# Patient Record
Sex: Female | Born: 1985 | Race: White | Marital: Married | Smoking: Never smoker
Health system: Southern US, Community
[De-identification: ages and names within clinical notes are randomized; demographics above are authoritative.]

## PROBLEM LIST (undated history)

## (undated) DIAGNOSIS — R51 Headache: Secondary | ICD-10-CM

## (undated) DIAGNOSIS — J45909 Unspecified asthma, uncomplicated: Secondary | ICD-10-CM

## (undated) DIAGNOSIS — F329 Major depressive disorder, single episode, unspecified: Secondary | ICD-10-CM

## (undated) DIAGNOSIS — O479 False labor, unspecified: Secondary | ICD-10-CM

## (undated) DIAGNOSIS — O4700 False labor before 37 completed weeks of gestation, unspecified trimester: Secondary | ICD-10-CM

## (undated) DIAGNOSIS — R519 Headache, unspecified: Secondary | ICD-10-CM

## (undated) DIAGNOSIS — F32A Depression, unspecified: Secondary | ICD-10-CM

## (undated) HISTORY — DX: Depression, unspecified: F32.A

## (undated) HISTORY — PX: HERNIA REPAIR: SHX51

## (undated) HISTORY — PX: WISDOM TOOTH EXTRACTION: SHX21

## (undated) HISTORY — DX: Major depressive disorder, single episode, unspecified: F32.9

## (undated) HISTORY — PX: FINGER SURGERY: SHX640

## (undated) HISTORY — PX: DILATION AND CURETTAGE OF UTERUS: SHX78

## (undated) HISTORY — PX: TONSILLECTOMY: SUR1361

## (undated) HISTORY — PX: TENDON REPAIR: SHX5111

## (undated) HISTORY — DX: Headache, unspecified: R51.9

## (undated) HISTORY — DX: Headache: R51

---

## 2013-05-05 HISTORY — PX: SHOULDER SURGERY: SHX246

## 2013-12-27 ENCOUNTER — Ambulatory Visit (INDEPENDENT_AMBULATORY_CARE_PROVIDER_SITE_OTHER): Payer: 59 | Admitting: Psychology

## 2013-12-27 DIAGNOSIS — F4323 Adjustment disorder with mixed anxiety and depressed mood: Secondary | ICD-10-CM

## 2014-01-12 ENCOUNTER — Ambulatory Visit (INDEPENDENT_AMBULATORY_CARE_PROVIDER_SITE_OTHER): Payer: 59 | Admitting: Psychology

## 2014-01-12 DIAGNOSIS — F4323 Adjustment disorder with mixed anxiety and depressed mood: Secondary | ICD-10-CM

## 2014-01-26 ENCOUNTER — Ambulatory Visit (INDEPENDENT_AMBULATORY_CARE_PROVIDER_SITE_OTHER): Payer: 59 | Admitting: Psychology

## 2014-01-26 DIAGNOSIS — F4323 Adjustment disorder with mixed anxiety and depressed mood: Secondary | ICD-10-CM

## 2014-02-05 ENCOUNTER — Encounter (HOSPITAL_COMMUNITY): Payer: Self-pay | Admitting: Emergency Medicine

## 2014-02-05 ENCOUNTER — Emergency Department (HOSPITAL_COMMUNITY): Payer: 59

## 2014-02-05 ENCOUNTER — Emergency Department (HOSPITAL_COMMUNITY)
Admission: EM | Admit: 2014-02-05 | Discharge: 2014-02-06 | Disposition: A | Discharge status: Home / Self Care | Payer: 59 | Attending: Emergency Medicine | Admitting: Emergency Medicine

## 2014-02-05 ENCOUNTER — Emergency Department (HOSPITAL_COMMUNITY)
Admission: EM | Admit: 2014-02-05 | Discharge: 2014-02-05 | Disposition: A | Discharge status: Nursing Home (Includes ICF) | Payer: 59 | Source: Home / Self Care | Attending: Family Medicine | Admitting: Family Medicine

## 2014-02-05 DIAGNOSIS — Z792 Long term (current) use of antibiotics: Secondary | ICD-10-CM | POA: Insufficient documentation

## 2014-02-05 DIAGNOSIS — Z3202 Encounter for pregnancy test, result negative: Secondary | ICD-10-CM | POA: Insufficient documentation

## 2014-02-05 DIAGNOSIS — J45909 Unspecified asthma, uncomplicated: Secondary | ICD-10-CM | POA: Diagnosis not present

## 2014-02-05 DIAGNOSIS — N8320 Unspecified ovarian cysts: Secondary | ICD-10-CM | POA: Insufficient documentation

## 2014-02-05 DIAGNOSIS — Z79899 Other long term (current) drug therapy: Secondary | ICD-10-CM | POA: Insufficient documentation

## 2014-02-05 DIAGNOSIS — N83202 Unspecified ovarian cyst, left side: Secondary | ICD-10-CM

## 2014-02-05 DIAGNOSIS — R1032 Left lower quadrant pain: Secondary | ICD-10-CM | POA: Diagnosis present

## 2014-02-05 DIAGNOSIS — Z9889 Other specified postprocedural states: Secondary | ICD-10-CM | POA: Diagnosis not present

## 2014-02-05 DIAGNOSIS — R197 Diarrhea, unspecified: Secondary | ICD-10-CM | POA: Diagnosis not present

## 2014-02-05 DIAGNOSIS — R140 Abdominal distension (gaseous): Secondary | ICD-10-CM

## 2014-02-05 DIAGNOSIS — R109 Unspecified abdominal pain: Secondary | ICD-10-CM

## 2014-02-05 HISTORY — DX: Unspecified asthma, uncomplicated: J45.909

## 2014-02-05 LAB — URINALYSIS, ROUTINE W REFLEX MICROSCOPIC
Bilirubin Urine: NEGATIVE
Glucose, UA: NEGATIVE mg/dL
Ketones, ur: NEGATIVE mg/dL
LEUKOCYTES UA: NEGATIVE
NITRITE: NEGATIVE
PH: 5 (ref 5.0–8.0)
Protein, ur: NEGATIVE mg/dL
SPECIFIC GRAVITY, URINE: 1.007 (ref 1.005–1.030)
Urobilinogen, UA: 0.2 mg/dL (ref 0.0–1.0)

## 2014-02-05 LAB — POCT URINALYSIS DIP (DEVICE)
Bilirubin Urine: NEGATIVE
GLUCOSE, UA: NEGATIVE mg/dL
Ketones, ur: NEGATIVE mg/dL
LEUKOCYTES UA: NEGATIVE
NITRITE: NEGATIVE
PH: 6 (ref 5.0–8.0)
Protein, ur: NEGATIVE mg/dL
Specific Gravity, Urine: 1.01 (ref 1.005–1.030)
Urobilinogen, UA: 0.2 mg/dL (ref 0.0–1.0)

## 2014-02-05 LAB — CBC WITH DIFFERENTIAL/PLATELET
BASOS ABS: 0 10*3/uL (ref 0.0–0.1)
BASOS PCT: 0 % (ref 0–1)
EOS ABS: 0.1 10*3/uL (ref 0.0–0.7)
EOS PCT: 1 % (ref 0–5)
HCT: 43.7 % (ref 36.0–46.0)
Hemoglobin: 15 g/dL (ref 12.0–15.0)
Lymphocytes Relative: 23 % (ref 12–46)
Lymphs Abs: 1.8 10*3/uL (ref 0.7–4.0)
MCH: 29.9 pg (ref 26.0–34.0)
MCHC: 34.3 g/dL (ref 30.0–36.0)
MCV: 87.1 fL (ref 78.0–100.0)
Monocytes Absolute: 0.6 10*3/uL (ref 0.1–1.0)
Monocytes Relative: 8 % (ref 3–12)
Neutro Abs: 5.4 10*3/uL (ref 1.7–7.7)
Neutrophils Relative %: 68 % (ref 43–77)
PLATELETS: 259 10*3/uL (ref 150–400)
RBC: 5.02 MIL/uL (ref 3.87–5.11)
RDW: 13.3 % (ref 11.5–15.5)
WBC: 7.9 10*3/uL (ref 4.0–10.5)

## 2014-02-05 LAB — COMPREHENSIVE METABOLIC PANEL
ALBUMIN: 3.9 g/dL (ref 3.5–5.2)
ALT: 11 U/L (ref 0–35)
AST: 29 U/L (ref 0–37)
Alkaline Phosphatase: 60 U/L (ref 39–117)
Anion gap: 13 (ref 5–15)
BUN: 7 mg/dL (ref 6–23)
CALCIUM: 9.7 mg/dL (ref 8.4–10.5)
CO2: 23 mEq/L (ref 19–32)
Chloride: 101 mEq/L (ref 96–112)
Creatinine, Ser: 0.6 mg/dL (ref 0.50–1.10)
GFR calc non Af Amer: >90 mL/min (ref >90)
Glucose, Bld: 94 mg/dL (ref 70–99)
Potassium: 4.7 mEq/L (ref 3.7–5.3)
Sodium: 137 mEq/L (ref 137–147)
TOTAL PROTEIN: 8.3 g/dL (ref 6.0–8.3)
Total Bilirubin: 0.3 mg/dL (ref 0.3–1.2)

## 2014-02-05 LAB — PREGNANCY, URINE: PREG TEST UR: NEGATIVE

## 2014-02-05 LAB — LIPASE, BLOOD: Lipase: 36 U/L (ref 11–59)

## 2014-02-05 LAB — URINE MICROSCOPIC-ADD ON

## 2014-02-05 LAB — POCT PREGNANCY, URINE: Preg Test, Ur: NEGATIVE

## 2014-02-05 MED ORDER — IOHEXOL 300 MG/ML  SOLN
25.0000 mL | Freq: Once | INTRAMUSCULAR | Status: AC | PRN
  Administered 2014-02-05: 25 mL via ORAL

## 2014-02-05 MED ORDER — ONDANSETRON HCL 4 MG/2ML IJ SOLN
4.0000 mg | Freq: Once | INTRAMUSCULAR | Status: AC
  Administered 2014-02-05: 4 mg via INTRAVENOUS
  Filled 2014-02-05: qty 2

## 2014-02-05 MED ORDER — IOHEXOL 300 MG/ML  SOLN
100.0000 mL | Freq: Once | INTRAMUSCULAR | Status: AC | PRN
  Administered 2014-02-05: 100 mL via INTRAVENOUS

## 2014-02-05 MED ORDER — MORPHINE SULFATE 4 MG/ML IJ SOLN
4.0000 mg | Freq: Once | INTRAMUSCULAR | Status: AC
  Administered 2014-02-05: 4 mg via INTRAVENOUS
  Filled 2014-02-05: qty 1

## 2014-02-05 NOTE — ED Provider Notes (Signed)
CSN: [id number]     Arrival date & time 02/05/14  1807 History   First MD Initiated Contact with Patient 02/05/14 1820     Chief Complaint  Patient presents with  . Bloated   (Consider location/radiation/quality/duration/timing/severity/associated sxs/prior Treatment) HPI Comments: Has had several abdominal surgeries in the past. Reports previous bilateral inguinal hernia repair with associated wound infection requiring multiple surgical debridements and wound vac placements. Also has had two Caesarian sections. Reports symptoms are made worse if she tries to eat or drink. States she feels as if she is "5 months pregnant."      Patient is a 28 y.o. female presenting with abdominal pain. The history is provided by the patient.  Abdominal Pain Pain location:  Periumbilical and LLQ Pain quality: cramping and sharp   Pain radiates to:  Does not radiate Pain severity:  Moderate Onset quality:  Gradual Duration:  36 hours Progression:  Waxing and waning Chronicity:  New Ineffective treatments:  Bowel activity (Has tried [place] stool softener and laxative with no relief) Associated symptoms: anorexia, nausea and vomiting   Associated symptoms: no belching, no chest pain, no chills, no constipation, no cough, no diarrhea, no dysuria, no fatigue, no fever, no flatus, no hematemesis, no hematochezia, no hematuria, no melena, no shortness of breath, no sore throat, no vaginal bleeding and no vaginal discharge   Risk factors: multiple surgeries     Past Medical History  Diagnosis Date  . Asthma    Past Surgical History  Procedure Laterality Date  . Hernia repair    . Cesarean section    . Tendon repair     No family history on file. History  Substance Use Topics  . Smoking status: Never Smoker   . Smokeless tobacco: Not on file  . Alcohol Use: Yes   OB History   Grav Para Term Preterm Abortions TAB SAB Ect Mult Living                 Review of Systems  Constitutional:  Positive for appetite change. Negative for fever, chills and fatigue.  HENT: Negative for sore throat.   Respiratory: Negative for cough, chest tightness and shortness of breath.   Cardiovascular: Negative for chest pain.  Gastrointestinal: Positive for nausea, vomiting, abdominal pain, abdominal distention and anorexia. Negative for diarrhea, constipation, blood in stool, melena, hematochezia, rectal pain, flatus and hematemesis.  Endocrine: Negative for polydipsia, polyphagia and polyuria.  Genitourinary: Negative for dysuria, frequency, hematuria, flank pain, vaginal bleeding, vaginal discharge, vaginal pain and pelvic pain.  Musculoskeletal: Negative for back pain.  Skin: Negative.   Neurological: Negative for dizziness, syncope, weakness and light-headedness.    Allergies  Iodine and Shellfish allergy  Home Medications   Prior to Admission medications   Medication Sig Start Date End Date Taking? Authorizing Provider  ALBUTEROL IN Inhale into the lungs.   Yes Historical Provider, MD  norethindrone-ethinyl estradiol (JUNEL FE,GILDESS FE,LOESTRIN FE) 1-20 MG-MCG tablet Take 1 tablet by mouth daily.   Yes Historical Provider, MD   BP 120/83  Pulse 90  Temp(Src) 98.6 F (37 C) (Oral)  Resp 16  SpO2 95%  LMP 01/08/2014 Physical Exam  Nursing note and vitals reviewed. Constitutional: She is oriented to person, place, and time. She appears well-developed and well-nourished. No distress.  HENT:  Head: Normocephalic and atraumatic.  Eyes: Conjunctivae are normal. No scleral icterus.  Cardiovascular: Normal rate, regular rhythm and normal heart sounds.   Pulmonary/Chest: Effort normal and breath sounds normal. No  respiratory distress. She has no wheezes.  Abdominal: Soft. She exhibits distension. She exhibits no fluid wave and no mass. Bowel sounds are increased. There is no hepatosplenomegaly. There is tenderness in the epigastric area, periumbilical area and left lower quadrant.  There is no rigidity, no rebound, no guarding, no CVA tenderness, no tenderness at McBurney's point and negative Murphy's sign. No hernia.  Musculoskeletal: Normal range of motion.  Neurological: She is alert and oriented to person, place, and time.  Skin: Skin is warm and dry.  Psychiatric: She has a normal mood and affect. Her behavior is normal.    ED Course  Procedures (including critical care time) Labs Review Labs Reviewed  POCT URINALYSIS DIP (DEVICE) - Abnormal; Notable for the following:    Hgb urine dipstick TRACE (*)    All other components within normal limits  POCT PREGNANCY, URINE    Imaging Review No results found.   MDM   1. Abdominal pain, unspecified abdominal location   2. Abdominal bloating    Given patient's history of multiple abdominal surgeries and current presenting set of symptoms, I am concerned about the possibility of early or partial bowel obstruction.  UA grossly normal and UPT negative.  [facility] for continued evaluation.     Ria ClockJennifer Lee H Hamdi Vari, PA 02/05/14 1856  As [facility] staff was moving patient to shuttle to transport to ER, husband decided that he would prefer to transport her in their private vehicle. Therefore, patient's disposition was changed from transfer to discharge as they declined original plan for transfer.   Ria ClockJennifer Lee H Akeem Heppler, [place] 02/05/14 1910

## 2014-02-05 NOTE — ED Notes (Signed)
On discharge, husband aggravated about wanting to take wife himself.  Attempted to explain the benefit of shuttle taking patient to ed while he made arrangements for 28 year old and 28 year old.  Patient left with husband/2 small children going to the ed.

## 2014-02-05 NOTE — ED Notes (Signed)
Abdominal bloating and cramping that started Saturday 10/3.  Patient has taken stool softeners yesterday and today.  Loose stool this afternoon 3:00 pm.  Denies burning with urination, denies vaginal discharge

## 2014-02-05 NOTE — ED Notes (Addendum)
[facility] here for abd pain/bloating, last BM 1500 scant & loose after laxatives and stool softeners, last normal BM was Friday. Also reports nausea, dry heaving. last ate 1400. No meds PTA. Concern for SBO. [facility]. pinpints pain to LLQ. (denies:urinary or vaginal sx, bleeding, fever or back pain).

## 2014-02-05 NOTE — ED Provider Notes (Signed)
28 year old female, history of prior abdominal surgery who presents from the urgent care to rule out small bowel obstruction or other pathologic abdominal process after developing abdominal pain in the left lower quadrant, she feels distended, has not had a bowel movement in several days, took stool softeners and had a subsequent small liquid stool prior to arrival. She is nauseated but not throwing up and on exam has mild distention, no definite exams to percussion and focal tenderness in the lower abdomen and the epigastrium. Labs, CT, symptomatic control, disposition pending workup and patient improvement.  CT shows no SBO but shows focal 3.5 cm cyst - no rupture, no torsion, stable for d/c.  I saw and evaluated the patient, reviewed the resident's note and I agree with the findings and plan.   Final diagnoses:  Left ovarian cyst      Vida RollerBrian D Kyree Adriano, MD 02/06/14 [phone]

## 2014-02-05 NOTE — ED Provider Notes (Signed)
CSN: [id number]     Arrival date & time 02/05/14  1913 History   First MD Initiated Contact with Patient 02/05/14 1939     Chief Complaint  Patient presents with  . Abdominal Pain     (Consider location/radiation/quality/duration/timing/severity/associated sxs/prior Treatment) Patient is a 28 y.o. female presenting with abdominal pain.  Abdominal Pain Pain location:  LLQ Pain quality: bloating and cramping   Pain radiates to:  Periumbilical region Pain severity:  Severe Onset quality:  Gradual Duration:  2 days Timing:  Constant Progression:  Worsening Chronicity:  New Context: previous surgery   Context: not eating and not recent sexual activity   Relieved by:  None tried Worsened by:  Palpation Ineffective treatments:  None tried Associated symptoms: diarrhea and nausea   Associated symptoms: no chest pain, no chills, no constipation, no cough, no dysuria, no fever, no shortness of breath, no sore throat and no vomiting   Risk factors: multiple surgeries     Past Medical History  Diagnosis Date  . Asthma    Past Surgical History  Procedure Laterality Date  . Hernia repair    . Cesarean section    . Tendon repair     No family history on file. History  Substance Use Topics  . Smoking status: Never Smoker   . Smokeless tobacco: Not on file  . Alcohol Use: Yes   OB History   Grav Para Term Preterm Abortions TAB SAB Ect Mult Living                 Review of Systems  Constitutional: Negative for fever and chills.  HENT: Negative for congestion and sore throat.   Eyes: Negative for visual disturbance.  Respiratory: Negative for cough, shortness of breath and wheezing.   Cardiovascular: Negative for chest pain.  Gastrointestinal: Positive for nausea, abdominal pain and diarrhea. Negative for vomiting and constipation.  Genitourinary: Negative for dysuria, difficulty urinating and vaginal pain.  Musculoskeletal: Negative for arthralgias and myalgias.  Skin:  Negative for rash.  Neurological: Negative for syncope and headaches.  Psychiatric/Behavioral: Negative for behavioral problems.  All other systems reviewed and are negative.     Allergies  Shellfish allergy and Iodine  Home Medications   Prior to Admission medications   Medication Sig Start Date End Date Taking? Authorizing Provider  albuterol (PROVENTIL HFA;VENTOLIN HFA) 108 (90 BASE) MCG/ACT inhaler Inhale 2 puffs into the lungs every 6 (six) hours as needed for wheezing or shortness of breath.   Yes Historical Provider, MD  amoxicillin (AMOXIL) 500 MG capsule Take 500 mg by mouth 3 (three) times daily.    Historical Provider, MD  norethindrone-ethinyl estradiol (JUNEL FE,GILDESS FE,LOESTRIN FE) 1-20 MG-MCG tablet Take 1 tablet by mouth daily.    Historical Provider, MD   BP 127/67  Pulse 82  Temp(Src) 98 F (36.7 C) (Oral)  Resp 18  SpO2 100%  LMP 01/08/2014 Physical Exam  Vitals reviewed. Constitutional: She is oriented to person, place, and time. She appears well-developed and well-nourished. No distress.  HENT:  Head: Normocephalic and atraumatic.  Eyes: EOM are normal.  Neck: Normal range of motion.  Cardiovascular: Normal rate, regular rhythm and normal heart sounds.   No murmur heard. Pulmonary/Chest: Effort normal and breath sounds normal. No respiratory distress. She has no wheezes.  Abdominal: Soft. There is tenderness in the left lower quadrant. There is no rigidity, no rebound, no guarding and no CVA tenderness.  Genitourinary: There is no lesion on the right labia. There  is no lesion on the left labia. Cervix exhibits no motion tenderness, no discharge and no friability. Right adnexum displays no tenderness. Left adnexum displays no tenderness. No erythema around the vagina. No vaginal discharge found.  Musculoskeletal: She exhibits no edema.  Neurological: She is alert and oriented to person, place, and time.  Skin: She is not diaphoretic.  Psychiatric: She  has a normal mood and affect. Her behavior is normal.    ED Course  Procedures (including critical care time) Labs Review Labs Reviewed  URINALYSIS, ROUTINE W REFLEX MICROSCOPIC - Abnormal; Notable for the following:    Hgb urine dipstick SMALL (*)    All other components within normal limits  CBC WITH DIFFERENTIAL  COMPREHENSIVE METABOLIC PANEL  LIPASE, BLOOD  PREGNANCY, URINE  URINE MICROSCOPIC-ADD ON    Imaging Review [place] Transvaginal Non-ob  02/06/2014   CLINICAL DATA:  Acute onset of left-sided pelvic pain. Bloating, nausea and diarrhea. Symptoms for 2 days. Initial encounter.  EXAM: TRANSABDOMINAL AND TRANSVAGINAL ULTRASOUND OF PELVIS  DOPPLER ULTRASOUND OF OVARIES  TECHNIQUE: Both transabdominal and transvaginal ultrasound examinations of the pelvis were performed. Transabdominal technique was performed for global imaging of the pelvis including uterus, ovaries, adnexal regions, and pelvic cul-de-sac.  It was necessary to proceed with endovaginal exam following the transabdominal exam to visualize the uterus and ovaries in greater detail. Color and duplex Doppler ultrasound was utilized to evaluate blood flow to the ovaries.  COMPARISON:  CT of the abdomen and pelvis performed earlier today at 9:41 p.m.  FINDINGS: Uterus  Measurements: 8.4 x 4.6 x 5.0 cm. No fibroids or other mass visualized.  Endometrium  Thickness: 0.4 cm.  No focal abnormality visualized.  Right ovary  Not visualized on this study.  Left ovary  Measurements: 4.0 x 3.7 x 3.7 cm. A simple cyst is noted at the left ovary, measuring 3.5 x 2.7 x 2.9 cm.  Pulsed Doppler evaluation of both ovaries demonstrates normal low-resistance arterial and venous waveforms.  Other findings  Trace free fluid is noted within the pelvic cul-de-sac.  IMPRESSION: 1. 3.5 cm simple cyst noted at the left ovary; this is likely physiologic in nature, and does not require follow-up. 2. Unremarkable pelvic ultrasound.   Electronically Signed   By:  Roanna RaiderJeffery  Chang M.D.   On: 02/06/2014 00:10   [place] Pelvis Complete  02/06/2014   CLINICAL DATA:  Acute onset of left-sided pelvic pain. Bloating, nausea and diarrhea. Symptoms for 2 days. Initial encounter.  EXAM: TRANSABDOMINAL AND TRANSVAGINAL ULTRASOUND OF PELVIS  DOPPLER ULTRASOUND OF OVARIES  TECHNIQUE: Both transabdominal and transvaginal ultrasound examinations of the pelvis were performed. Transabdominal technique was performed for global imaging of the pelvis including uterus, ovaries, adnexal regions, and pelvic cul-de-sac.  It was necessary to proceed with endovaginal exam following the transabdominal exam to visualize the uterus and ovaries in greater detail. Color and duplex Doppler ultrasound was utilized to evaluate blood flow to the ovaries.  COMPARISON:  CT of the abdomen and pelvis performed earlier today at 9:41 p.m.  FINDINGS: Uterus  Measurements: 8.4 x 4.6 x 5.0 cm. No fibroids or other mass visualized.  Endometrium  Thickness: 0.4 cm.  No focal abnormality visualized.  Right ovary  Not visualized on this study.  Left ovary  Measurements: 4.0 x 3.7 x 3.7 cm. A simple cyst is noted at the left ovary, measuring 3.5 x 2.7 x 2.9 cm.  Pulsed Doppler evaluation of both ovaries demonstrates normal low-resistance arterial and venous waveforms.  Other  findings  Trace free fluid is noted within the pelvic cul-de-sac.  IMPRESSION: 1. 3.5 cm simple cyst noted at the left ovary; this is likely physiologic in nature, and does not require follow-up. 2. Unremarkable pelvic ultrasound.   Electronically Signed   By: Roanna Raider M.D.   On: 02/06/2014 00:10   Ct Abdomen Pelvis W Contrast  02/05/2014   CLINICAL DATA:  Left upper quadrant and left lower quadrant abdominal pain. Bloating. Nausea. Diarrhea. Symptoms for 2 days. Personal history of C-section and hernia repairs.  EXAM: CT ABDOMEN AND PELVIS WITH CONTRAST  TECHNIQUE: Multidetector CT imaging of the abdomen and pelvis was performed using the  standard protocol following bolus administration of intravenous contrast.  CONTRAST:  OMNIPAQUE IOHEXOL 300 MG/ML  SOLN  COMPARISON:  None.  FINDINGS: The lung bases are clear without focal nodule, mass, or airspace disease. The heart size is normal. No significant pleural or pericardial effusion is present.  The liver and spleen are within normal limits. The stomach, duodenum, and pancreas are unremarkable. Common bile duct and gallbladder are normal. The adrenal glands are normal bilaterally. Kidneys and ureters are within normal limits. Urinary bladder is within normal limits.  The rectosigmoid colon is within normal limits. Of the remainder the colon is unremarkable. The appendix is visualized and normal. The small bowel is unremarkable.  The uterus is within normal limits. A cystic lesion within the left adnexa measures 3.9 x 2.9 x 4.8 cm. A small amount of free fluid within the anatomic pelvis is likely physiologic. The right adnexa is within normal limits. There is no significant adenopathy.  Bone windows are unremarkable.  IMPRESSION: 1. Cystic lesion in the left adnexa measures 3.9 x 2.9 x 4.8 cm. This appears to be a benign cyst. Follow-up ultrasound at 6 or 10 weeks could be used for further evaluation. 2. Otherwise unremarkable CT of the abdomen and pelvis.   Electronically Signed   By: Gennette Pac M.D.   On: 02/05/2014 22:04   [place] Art/ven Flow Abd Pelv Doppler  02/06/2014   CLINICAL DATA:  Acute onset of left-sided pelvic pain. Bloating, nausea and diarrhea. Symptoms for 2 days. Initial encounter.  EXAM: TRANSABDOMINAL AND TRANSVAGINAL ULTRASOUND OF PELVIS  DOPPLER ULTRASOUND OF OVARIES  TECHNIQUE: Both transabdominal and transvaginal ultrasound examinations of the pelvis were performed. Transabdominal technique was performed for global imaging of the pelvis including uterus, ovaries, adnexal regions, and pelvic cul-de-sac.  It was necessary to proceed with endovaginal exam following the  transabdominal exam to visualize the uterus and ovaries in greater detail. Color and duplex Doppler ultrasound was utilized to evaluate blood flow to the ovaries.  COMPARISON:  CT of the abdomen and pelvis performed earlier today at 9:41 p.m.  FINDINGS: Uterus  Measurements: 8.4 x 4.6 x 5.0 cm. No fibroids or other mass visualized.  Endometrium  Thickness: 0.4 cm.  No focal abnormality visualized.  Right ovary  Not visualized on this study.  Left ovary  Measurements: 4.0 x 3.7 x 3.7 cm. A simple cyst is noted at the left ovary, measuring 3.5 x 2.7 x 2.9 cm.  Pulsed Doppler evaluation of both ovaries demonstrates normal low-resistance arterial and venous waveforms.  Other findings  Trace free fluid is noted within the pelvic cul-de-sac.  IMPRESSION: 1. 3.5 cm simple cyst noted at the left ovary; this is likely physiologic in nature, and does not require follow-up. 2. Unremarkable pelvic ultrasound.   Electronically Signed   By: Beryle Beams.D.  On: 02/06/2014 00:10     EKG Interpretation None      MDM   Final diagnoses:  Left ovarian cyst    Patient is a 28 year old female with a history of multiple normal surgeries secondary to pregnancy that presents with 2 days of abdominal bloating and left lower quadrant pain. Patient was seen in urgent care and there was concern for a small bowel traction and she was sent to the ED for further evaluation. Patient is afebrile stable vital signs. On exam the patient appears comfortable however does have left lower quadrant tenderness to palpation but no signs of peritonitis. On pelvic exam patient has no adnexal tenderness and no cervical motion tenderness. CT is seen above significant for a large left ovarian cysts. Given concern for possible torsion ultrasound was done which showed no evidence of decreased blood flow and a simple cyst. Patient advised to use Motrin for symptom control and given information to establish with a OB/GYN for  followup.    Beverely Risen, MD 02/06/14 [phone]

## 2014-02-06 NOTE — Discharge Instructions (Signed)
Ovarian Cyst An ovarian cyst is a fluid-filled sac that forms on an ovary. The ovaries are small organs that produce eggs in women. Various types of cysts can form on the ovaries. Most are not cancerous. Many do not cause problems, and they often go away on their own. Some may cause symptoms and require treatment. Common types of ovarian cysts include:  Functional cysts--These cysts may occur every month during the menstrual cycle. This is normal. The cysts usually go away with the next menstrual cycle if the woman does not get pregnant. Usually, there are no symptoms with a functional cyst.  Endometrioma cysts--These cysts form from the tissue that lines the uterus. They are also called "chocolate cysts" because they become filled with blood that turns brown. This type of cyst can cause pain in the lower abdomen during intercourse and with your menstrual period.  Cystadenoma cysts--This type develops from the cells on the outside of the ovary. These cysts can get very big and cause lower abdomen pain and pain with intercourse. This type of cyst can twist on itself, cut off its blood supply, and cause severe pain. It can also easily rupture and cause a lot of pain.  Dermoid cysts--This type of cyst is sometimes found in both ovaries. These cysts may contain different kinds of body tissue, such as skin, teeth, hair, or cartilage. They usually do not cause symptoms unless they get very big.  Theca lutein cysts--These cysts occur when too much of a certain hormone (human chorionic gonadotropin) is produced and overstimulates the ovaries to produce an egg. This is most common after procedures used to assist with the conception of a baby (in vitro fertilization). CAUSES   Fertility drugs can cause a condition in which multiple large cysts are formed on the ovaries. This is called ovarian hyperstimulation syndrome.  A condition called polycystic ovary syndrome can cause hormonal imbalances that can lead to  nonfunctional ovarian cysts. SIGNS AND SYMPTOMS  Many ovarian cysts do not cause symptoms. If symptoms are present, they may include:  Pelvic pain or pressure.  Pain in the lower abdomen.  Pain during sexual intercourse.  Increasing girth (swelling) of the abdomen.  Abnormal menstrual periods.  Increasing pain with menstrual periods.  Stopping having menstrual periods without being pregnant. DIAGNOSIS  These cysts are commonly found during a routine or annual pelvic exam. Tests may be ordered to find out more about the cyst. These tests may include:  Ultrasound.  X-ray of the pelvis.  CT scan.  MRI.  Blood tests. TREATMENT  Many ovarian cysts go away on their own without treatment. Your health care provider may want to check your cyst regularly for 2-3 months to see if it changes. For women in menopause, it is particularly important to monitor a cyst closely because of the higher rate of ovarian cancer in menopausal women. When treatment is needed, it may include any of the following:  A procedure to drain the cyst (aspiration). This may be done using a long needle and ultrasound. It can also be done through a laparoscopic procedure. This involves using a thin, lighted tube with a tiny camera on the end (laparoscope) inserted through a small incision.  Surgery to remove the whole cyst. This may be done using laparoscopic surgery or an open surgery involving a larger incision in the lower abdomen.  Hormone treatment or birth control pills. These methods are sometimes used to help dissolve a cyst. HOME CARE INSTRUCTIONS   Only take over-the-counter   or prescription medicines as directed by your health care provider.  Follow up with your health care provider as directed.  Get regular pelvic exams and Pap tests. SEEK MEDICAL CARE IF:   Your periods are late, irregular, or painful, or they stop.  Your pelvic pain or abdominal pain does not go away.  Your abdomen becomes  larger or swollen.  You have pressure on your bladder or trouble emptying your bladder completely.  You have pain during sexual intercourse.  You have feelings of fullness, pressure, or discomfort in your stomach.  You lose weight for no apparent reason.  You feel generally ill.  You become constipated.  You lose your appetite.  You develop acne.  You have an increase in body and facial hair.  You are gaining weight, without changing your exercise and eating habits.  You think you are pregnant. SEEK IMMEDIATE MEDICAL CARE IF:   You have increasing abdominal pain.  You feel sick to your stomach (nauseous), and you throw up (vomit).  You develop a fever that comes on suddenly.  You have abdominal pain during a bowel movement.  Your menstrual periods become heavier than usual. MAKE SURE YOU:  Understand these instructions.  Will watch your condition.  Will get help right away if you are not doing well or get worse. Document Released: 04/21/2005 Document Revised: 04/26/2013 Document Reviewed: 12/27/2012 [employer] Patient Information 2015 [employer]. This information is not intended to replace advice given to you by your health care provider. Make sure you discuss any questions you have with your health care provider.  

## 2014-02-06 NOTE — ED Provider Notes (Signed)
I saw and evaluated the patient, reviewed the resident's note and I agree with the findings and plan.  Please see my separate note regarding my evaluation of the patient.     Vida RollerBrian D Raul Winterhalter, MD 02/06/14 [phone]

## 2014-02-07 NOTE — ED Provider Notes (Signed)
Medical screening examination/treatment/procedure(s) were performed by resident physician or non-physician practitioner and as supervising physician I was immediately available for consultation/collaboration.   Levoy Geisen DOUGLAS MD.   Prinston Kynard D Zaccheaus Storlie, MD 02/07/14 1134 

## 2014-02-09 ENCOUNTER — Inpatient Hospital Stay (HOSPITAL_COMMUNITY)
Admission: EM | Admit: 2014-02-09 | Discharge: 2014-02-10 | Disposition: A | Discharge status: Other Acute Inpatient Hospital | Payer: 59 | Attending: Obstetrics & Gynecology | Admitting: Obstetrics & Gynecology

## 2014-02-09 ENCOUNTER — Encounter (HOSPITAL_COMMUNITY): Payer: Self-pay | Admitting: Emergency Medicine

## 2014-02-09 DIAGNOSIS — R630 Anorexia: Secondary | ICD-10-CM | POA: Diagnosis not present

## 2014-02-09 DIAGNOSIS — Z3202 Encounter for pregnancy test, result negative: Secondary | ICD-10-CM | POA: Diagnosis not present

## 2014-02-09 DIAGNOSIS — R1032 Left lower quadrant pain: Secondary | ICD-10-CM | POA: Diagnosis present

## 2014-02-09 DIAGNOSIS — J45909 Unspecified asthma, uncomplicated: Secondary | ICD-10-CM | POA: Insufficient documentation

## 2014-02-09 DIAGNOSIS — Z9889 Other specified postprocedural states: Secondary | ICD-10-CM | POA: Diagnosis not present

## 2014-02-09 DIAGNOSIS — Z79899 Other long term (current) drug therapy: Secondary | ICD-10-CM | POA: Diagnosis not present

## 2014-02-09 DIAGNOSIS — N946 Dysmenorrhea, unspecified: Secondary | ICD-10-CM | POA: Diagnosis not present

## 2014-02-09 DIAGNOSIS — N8320 Unspecified ovarian cysts: Secondary | ICD-10-CM | POA: Insufficient documentation

## 2014-02-09 DIAGNOSIS — N83202 Unspecified ovarian cyst, left side: Secondary | ICD-10-CM

## 2014-02-09 LAB — URINE MICROSCOPIC-ADD ON

## 2014-02-09 LAB — I-STAT CHEM 8, ED
BUN: 9 mg/dL (ref 6–23)
Calcium, Ion: 1.08 mmol/L — ABNORMAL LOW (ref 1.12–1.23)
Chloride: 109 mEq/L (ref 96–112)
Creatinine, Ser: 0.6 mg/dL (ref 0.50–1.10)
GLUCOSE: 96 mg/dL (ref 70–99)
HCT: 40 % (ref 36.0–46.0)
Hemoglobin: 13.6 g/dL (ref 12.0–15.0)
Potassium: 3.7 mEq/L (ref 3.7–5.3)
Sodium: 137 mEq/L (ref 137–147)
TCO2: 22 mmol/L (ref 0–100)

## 2014-02-09 LAB — CBC WITH DIFFERENTIAL/PLATELET
BASOS ABS: 0 10*3/uL (ref 0.0–0.1)
BASOS PCT: 0 % (ref 0–1)
Eosinophils Absolute: 0.1 10*3/uL (ref 0.0–0.7)
Eosinophils Relative: 1 % (ref 0–5)
HCT: 37.4 % (ref 36.0–46.0)
Hemoglobin: 12.9 g/dL (ref 12.0–15.0)
Lymphocytes Relative: 31 % (ref 12–46)
Lymphs Abs: 1.8 10*3/uL (ref 0.7–4.0)
MCH: 30 pg (ref 26.0–34.0)
MCHC: 34.5 g/dL (ref 30.0–36.0)
MCV: 87 fL (ref 78.0–100.0)
Monocytes Absolute: 0.5 10*3/uL (ref 0.1–1.0)
Monocytes Relative: 9 % (ref 3–12)
NEUTROS PCT: 59 % (ref 43–77)
Neutro Abs: 3.4 10*3/uL (ref 1.7–7.7)
PLATELETS: 235 10*3/uL (ref 150–400)
RBC: 4.3 MIL/uL (ref 3.87–5.11)
RDW: 13.3 % (ref 11.5–15.5)
WBC: 5.7 10*3/uL (ref 4.0–10.5)

## 2014-02-09 LAB — URINALYSIS, ROUTINE W REFLEX MICROSCOPIC
Bilirubin Urine: NEGATIVE
Glucose, UA: NEGATIVE mg/dL
KETONES UR: NEGATIVE mg/dL
Leukocytes, UA: NEGATIVE
NITRITE: NEGATIVE
PH: 6 (ref 5.0–8.0)
Protein, ur: NEGATIVE mg/dL
Specific Gravity, Urine: 1.024 (ref 1.005–1.030)
Urobilinogen, UA: 0.2 mg/dL (ref 0.0–1.0)

## 2014-02-09 LAB — WET PREP, GENITAL
Trich, Wet Prep: NONE SEEN
Yeast Wet Prep HPF POC: NONE SEEN

## 2014-02-09 LAB — PREGNANCY, URINE: Preg Test, Ur: NEGATIVE

## 2014-02-09 MED ORDER — DIPHENHYDRAMINE HCL 50 MG/ML IJ SOLN
25.0000 mg | Freq: Once | INTRAMUSCULAR | Status: AC
  Administered 2014-02-09: 25 mg via INTRAVENOUS
  Filled 2014-02-09: qty 1

## 2014-02-09 MED ORDER — KETOROLAC TROMETHAMINE 30 MG/ML IJ SOLN
30.0000 mg | Freq: Once | INTRAMUSCULAR | Status: AC
  Administered 2014-02-09: 30 mg via INTRAVENOUS
  Filled 2014-02-09: qty 1

## 2014-02-09 MED ORDER — MORPHINE SULFATE 4 MG/ML IJ SOLN
4.0000 mg | Freq: Once | INTRAMUSCULAR | Status: AC
Start: 2014-02-09 — End: 2014-02-09
  Administered 2014-02-09: 4 mg via INTRAVENOUS
  Filled 2014-02-09: qty 1

## 2014-02-09 MED ORDER — OXYCODONE-ACETAMINOPHEN 5-325 MG PO TABS
1.0000 | ORAL_TABLET | Freq: Once | ORAL | Status: AC
  Administered 2014-02-09: 1 via ORAL
  Filled 2014-02-09: qty 1

## 2014-02-09 MED ORDER — MORPHINE SULFATE 4 MG/ML IJ SOLN
4.0000 mg | Freq: Once | INTRAMUSCULAR | Status: AC
  Administered 2014-02-09: 4 mg via INTRAVENOUS
  Filled 2014-02-09: qty 1

## 2014-02-09 MED ORDER — HYDROMORPHONE HCL 1 MG/ML IJ SOLN
1.0000 mg | Freq: Once | INTRAMUSCULAR | Status: AC
  Administered 2014-02-10: 1 mg via INTRAVENOUS
  Filled 2014-02-09: qty 1

## 2014-02-09 MED ORDER — ONDANSETRON 4 MG PO TBDP
8.0000 mg | ORAL_TABLET | Freq: Once | ORAL | Status: AC
  Administered 2014-02-09: 8 mg via ORAL
  Filled 2014-02-09: qty 2

## 2014-02-09 NOTE — ED Notes (Signed)
Pelvic cart ready @ bedside. 

## 2014-02-09 NOTE — ED Notes (Signed)
Pt seen Sunday and dx with ovarian cyst. Pt has follow up apt next week but pain is too severe.

## 2014-02-09 NOTE — ED Provider Notes (Signed)
CSN: [id number]     Arrival date & time 02/09/14  1837 History   First MD Initiated Contact with Patient 02/09/14 2204     Chief Complaint  Patient presents with  . Abdominal Pain    (Consider location/radiation/quality/duration/timing/severity/associated sxs/prior Treatment) HPI Comments: Patient is a 28 year old female with a history of cesarean section and hernia repair who presents to the emergency department for further evaluation of left lower quadrant abdominal pain. Patient was evaluated for same on 02/06/2014 and diagnosed with left ovarian cyst. Patient states that she has been trying to manage her symptoms at home with Tylenol and Motrin, but her pain has been constant and gradually worsening since her discharge from the ED. She states that her pain intermittently radiates down to her right thigh. Symptoms associated with nausea and anorexia. She also notes metrorrhagia; LMP 02/06/14. Patient denies associated fever, chest pain, shortness of breath, vomiting, diarrhea, melena or hematochezia, dysuria, vaginal discharge, and syncope.  Patient is a 28 y.o. female presenting with abdominal pain. The history is provided by the patient. No language interpreter was used.  Abdominal Pain Associated symptoms: nausea and vaginal bleeding     Past Medical History  Diagnosis Date  . Asthma    Past Surgical History  Procedure Laterality Date  . Hernia repair    . Cesarean section    . Tendon repair     No family history on file. History  Substance Use Topics  . Smoking status: Never Smoker   . Smokeless tobacco: Not on file  . Alcohol Use: Yes   OB History   Grav Para Term Preterm Abortions TAB SAB Ect Mult Living                  Review of Systems  Constitutional: Positive for appetite change.  Gastrointestinal: Positive for nausea and abdominal pain.  Genitourinary: Positive for vaginal bleeding.  All other systems reviewed and are negative.   Allergies  Shellfish  allergy and Iodine  Home Medications   Prior to Admission medications   Medication Sig Start Date End Date Taking? Authorizing Provider  acetaminophen (TYLENOL) 500 MG tablet Take 500 mg by mouth every 6 (six) hours as needed.   Yes Historical Provider, MD  albuterol (PROVENTIL HFA;VENTOLIN HFA) 108 (90 BASE) MCG/ACT inhaler Inhale 2 puffs into the lungs every 6 (six) hours as needed for wheezing or shortness of breath.   Yes Historical Provider, MD  amoxicillin (AMOXIL) 500 MG capsule Take 500 mg by mouth 3 (three) times daily. Completed course of medication sometime last week from today (02-09-14) per patient    Historical Provider, MD   BP 113/65  Pulse 88  Temp(Src) 97.7 F (36.5 C) (Oral)  Resp 16  Ht 5' (1.524 m)  Wt 146 lb (66.225 kg)  BMI 28.51 kg/m2  SpO2 99%  LMP 02/06/2014  Physical Exam  Nursing note and vitals reviewed. Constitutional: She is oriented to person, place, and time. She appears well-developed and well-nourished. No distress.  Nontoxic/nonseptic appearing  HENT:  Head: Normocephalic and atraumatic.  Eyes: Conjunctivae and EOM are normal. No scleral icterus.  Neck: Normal range of motion.  Cardiovascular: Normal rate, regular rhythm and normal heart sounds.   Pulmonary/Chest: Effort normal and breath sounds normal. No respiratory distress. She has no wheezes. She has no rales.  Chest expansion symmetric  Abdominal: Soft. She exhibits no distension. There is tenderness. There is no rebound and no guarding.  Soft abdomen with focal tenderness in the left  lower quadrant. No peritoneal signs or involuntary guarding. No masses.  Genitourinary: There is no rash, tenderness, lesion or injury on the right labia. There is no rash, tenderness, lesion or injury on the left labia. Uterus is not tender. Cervix exhibits no motion tenderness and no friability. Right adnexum displays no mass, no tenderness and no fullness. Left adnexum displays tenderness. Left adnexum  displays no mass and no fullness. There is bleeding (blood in vaginal vault c/w menses) around the vagina.  Musculoskeletal: Normal range of motion.  Neurological: She is alert and oriented to person, place, and time. She exhibits normal muscle tone. Coordination normal.  Skin: Skin is warm and dry. No rash noted. She is not diaphoretic. No erythema. No pallor.  Psychiatric: She has a normal mood and affect. Her behavior is normal.    ED Course  Procedures (including critical care time) Labs Review Labs Reviewed  WET PREP, GENITAL - Abnormal; Notable for the following:    Clue Cells Wet Prep HPF POC FEW (*)    WBC, Wet Prep HPF POC FEW (*)    All other components within normal limits  URINALYSIS, ROUTINE W REFLEX MICROSCOPIC - Abnormal; Notable for the following:    Hgb urine dipstick LARGE (*)    All other components within normal limits  I-STAT CHEM 8, ED - Abnormal; Notable for the following:    Calcium, Ion 1.08 (*)    All other components within normal limits  GC/CHLAMYDIA PROBE AMP  PREGNANCY, URINE  CBC WITH DIFFERENTIAL  URINE MICROSCOPIC-ADD ON    Imaging Review US Transvaginal Non-ob  02/06/2014   CLINICAL DATA:  Acute onset of left-sided pelvic pain. Bloating, nausea and diarrhea. Symptoms for 2 days. Initial encounter.  EXAM: TRANSABDOMINAL AND TRANSVAGINAL ULTRASOUND OF PELVIS  DOPPLER ULTRASOUND OF OVARIES  TECHNIQUE: Both transabdominal and transvaginal ultrasound examinations of the pelvis were performed. Transabdominal technique was performed for global imaging of the pelvis including uterus, ovaries, adnexal regions, and pelvic cul-de-sac.  It was necessary to proceed with endovaginal exam following the transabdominal exam to visualize the uterus and ovaries in greater detail. Color and duplex Doppler ultrasound was utilized to evaluate blood flow to the ovaries.  COMPARISON:  CT of the abdomen and pelvis performed earlier today at 9:41 p.m.  FINDINGS: Uterus   Measurements: 8.4 x 4.6 x 5.0 cm. No fibroids or other mass visualized.  Endometrium  Thickness: 0.4 cm.  No focal abnormality visualized.  Right ovary  Not visualized on this study.  Left ovary  Measurements: 4.0 x 3.7 x 3.7 cm. A simple cyst is noted at the left ovary, measuring 3.5 x 2.7 x 2.9 cm.  Pulsed Doppler evaluation of both ovaries demonstrates normal low-resistance arterial and venous waveforms.  Other findings  Trace free fluid is noted within the pelvic cul-de-sac.  IMPRESSION: 1. 3.5 cm simple cyst noted at the left ovary; this is likely physiologic in nature, and does not require follow-up. 2. Unremarkable pelvic ultrasound.   Electronically Signed   By: Roanna Raider M.D.   On: 02/06/2014 00:10   US Pelvis Complete  02/06/2014   CLINICAL DATA:  Acute onset of left-sided pelvic pain. Bloating, nausea and diarrhea. Symptoms for 2 days. Initial encounter.  EXAM: TRANSABDOMINAL AND TRANSVAGINAL ULTRASOUND OF PELVIS  DOPPLER ULTRASOUND OF OVARIES  TECHNIQUE: Both transabdominal and transvaginal ultrasound examinations of the pelvis were performed. Transabdominal technique was performed for global imaging of the pelvis including uterus, ovaries, adnexal regions, and pelvic cul-de-sac.  It  was necessary to proceed with endovaginal exam following the transabdominal exam to visualize the uterus and ovaries in greater detail. Color and duplex Doppler ultrasound was utilized to evaluate blood flow to the ovaries.  COMPARISON:  CT of the abdomen and pelvis performed earlier today at 9:41 p.m.  FINDINGS: Uterus  Measurements: 8.4 x 4.6 x 5.0 cm. No fibroids or other mass visualized.  Endometrium  Thickness: 0.4 cm.  No focal abnormality visualized.  Right ovary  Not visualized on this study.  Left ovary  Measurements: 4.0 x 3.7 x 3.7 cm. A simple cyst is noted at the left ovary, measuring 3.5 x 2.7 x 2.9 cm.  Pulsed Doppler evaluation of both ovaries demonstrates normal low-resistance arterial and venous  waveforms.  Other findings  Trace free fluid is noted within the pelvic cul-de-sac.  IMPRESSION: 1. 3.5 cm simple cyst noted at the left ovary; this is likely physiologic in nature, and does not require follow-up. 2. Unremarkable pelvic ultrasound.   Electronically Signed   By: Roanna Raider M.D.   On: 02/06/2014 00:10   Ct Abdomen Pelvis W Contrast  02/05/2014   CLINICAL DATA:  Left upper quadrant and left lower quadrant abdominal pain. Bloating. Nausea. Diarrhea. Symptoms for 2 days. Personal history of C-section and hernia repairs.  EXAM: CT ABDOMEN AND PELVIS WITH CONTRAST  TECHNIQUE: Multidetector CT imaging of the abdomen and pelvis was performed using the standard protocol following bolus administration of intravenous contrast.  CONTRAST:  OMNIPAQUE IOHEXOL 300 MG/ML  SOLN  COMPARISON:  None.  FINDINGS: The lung bases are clear without focal nodule, mass, or airspace disease. The heart size is normal. No significant pleural or pericardial effusion is present.  The liver and spleen are within normal limits. The stomach, duodenum, and pancreas are unremarkable. Common bile duct and gallbladder are normal. The adrenal glands are normal bilaterally. Kidneys and ureters are within normal limits. Urinary bladder is within normal limits.  The rectosigmoid colon is within normal limits. Of the remainder the colon is unremarkable. The appendix is visualized and normal. The small bowel is unremarkable.  The uterus is within normal limits. A cystic lesion within the left adnexa measures 3.9 x 2.9 x 4.8 cm. A small amount of free fluid within the anatomic pelvis is likely physiologic. The right adnexa is within normal limits. There is no significant adenopathy.  Bone windows are unremarkable.  IMPRESSION: 1. Cystic lesion in the left adnexa measures 3.9 x 2.9 x 4.8 cm. This appears to be a benign cyst. Follow-up ultrasound at 6 or 10 weeks could be used for further evaluation. 2. Otherwise unremarkable CT of  the abdomen and pelvis.   Electronically Signed   By: Gennette Pac M.D.   On: 02/05/2014 22:04   [place] Art/ven Flow Abd Pelv Doppler  02/06/2014   CLINICAL DATA:  Acute onset of left-sided pelvic pain. Bloating, nausea and diarrhea. Symptoms for 2 days. Initial encounter.  EXAM: TRANSABDOMINAL AND TRANSVAGINAL ULTRASOUND OF PELVIS  DOPPLER ULTRASOUND OF OVARIES  TECHNIQUE: Both transabdominal and transvaginal ultrasound examinations of the pelvis were performed. Transabdominal technique was performed for global imaging of the pelvis including uterus, ovaries, adnexal regions, and pelvic cul-de-sac.  It was necessary to proceed with endovaginal exam following the transabdominal exam to visualize the uterus and ovaries in greater detail. Color and duplex Doppler ultrasound was utilized to evaluate blood flow to the ovaries.  COMPARISON:  CT of the abdomen and pelvis performed earlier today at 9:41 p.m.  FINDINGS: Uterus  Measurements: 8.4 x 4.6 x 5.0 cm. No fibroids or other mass visualized.  Endometrium  Thickness: 0.4 cm.  No focal abnormality visualized.  Right ovary  Not visualized on this study.  Left ovary  Measurements: 4.0 x 3.7 x 3.7 cm. A simple cyst is noted at the left ovary, measuring 3.5 x 2.7 x 2.9 cm.  Pulsed Doppler evaluation of both ovaries demonstrates normal low-resistance arterial and venous waveforms.  Other findings  Trace free fluid is noted within the pelvic cul-de-sac.  IMPRESSION: 1. 3.5 cm simple cyst noted at the left ovary; this is likely physiologic in nature, and does not require follow-up. 2. Unremarkable pelvic ultrasound.   Electronically Signed   By: Roanna Raider M.D.   On: 02/06/2014 00:10    EKG Interpretation None      MDM   Final diagnoses:  Left lower quadrant pain  Dysmenorrhea  Left ovarian cyst    28 year old female presents to the emergency department for left lower quadrant abdominal pain. Patient was seen and evaluated for same 4 days ago. She was  diagnosed with a left ovarian cyst at this time. Cyst appeared uncomplicated on imaging. Patient states the pain has been persistent despite attempts to manage her pain with Tylenol and/or Motrin.  Patient noted to have left adnexal tenderness on exam. No cervical motion tenderness or friability. No peritoneal signs. Patient currently menstruating which is likely the cause of her hematuria today. She denies any dysuria and has no signs of urinary tract infection. Urine pregnancy negative. No leukocytosis today to suggest infection. No anemia or electrolyte imbalance. Lactate normal.  Symptoms have been gradually worsening since her visit on 02/05/2014. Given the gradual progression of symptoms, ovarian torsion thought to be less likely; however, patient has been unable to have adequate pain control after being given 30mg  Toradol, morphine 4mg  x 2, and 1 mg Dilaudid x 2. Pelvic ultrasound ordered for further evaluation at this time and obstetrics consulted for admission for pain control. Dr. Erin Fulling has accepted the patient at [facility] for further evaluation and management of abdominal pain. Patient to be transferred upon completion of her Pelvic U/S.   Filed Vitals:   02/10/14 0151 02/10/14 0450 02/10/14 0522 02/10/14 0628  BP: 113/65 119/55 122/65 111/62  Pulse: 88 79 80 75  Temp:  97.8 F (36.6 C) 98.5 F (36.9 C)   TempSrc:  Oral Oral   Resp: 16 16 18 18   Height:      Weight:      SpO2: 99% 100% 100%        Antony Madura, PA-C 02/10/14 [phone]

## 2014-02-10 ENCOUNTER — Emergency Department (HOSPITAL_COMMUNITY): Payer: 59

## 2014-02-10 ENCOUNTER — Encounter (HOSPITAL_COMMUNITY): Payer: Self-pay | Admitting: *Deleted

## 2014-02-10 DIAGNOSIS — R1032 Left lower quadrant pain: Secondary | ICD-10-CM

## 2014-02-10 DIAGNOSIS — N946 Dysmenorrhea, unspecified: Secondary | ICD-10-CM

## 2014-02-10 DIAGNOSIS — N8320 Unspecified ovarian cysts: Secondary | ICD-10-CM

## 2014-02-10 LAB — I-STAT CG4 LACTIC ACID, ED: Lactic Acid, Venous: 1.48 mmol/L (ref 0.5–2.2)

## 2014-02-10 MED ORDER — ONDANSETRON HCL 4 MG/2ML IJ SOLN
4.0000 mg | Freq: Once | INTRAMUSCULAR | Status: AC
  Administered 2014-02-10: 4 mg via INTRAVENOUS
  Filled 2014-02-10: qty 2

## 2014-02-10 MED ORDER — OXYCODONE-ACETAMINOPHEN 5-325 MG PO TABS: 1.0000 | ORAL_TABLET | ORAL | Status: DC | PRN

## 2014-02-10 MED ORDER — HYDROMORPHONE HCL 1 MG/ML IJ SOLN
1.0000 mg | Freq: Once | INTRAMUSCULAR | Status: AC
  Administered 2014-02-10: 1 mg via INTRAVENOUS
  Filled 2014-02-10: qty 1

## 2014-02-10 MED ORDER — HYDROMORPHONE HCL 1 MG/ML IJ SOLN
0.5000 mg | Freq: Once | INTRAMUSCULAR | Status: AC
  Administered 2014-02-10: 0.5 mg via INTRAVENOUS
  Filled 2014-02-10: qty 1

## 2014-02-10 MED ORDER — KETOROLAC TROMETHAMINE 30 MG/ML IJ SOLN
30.0000 mg | Freq: Once | INTRAMUSCULAR | Status: AC
  Administered 2014-02-10: 30 mg via INTRAVENOUS
  Filled 2014-02-10: qty 1

## 2014-02-10 NOTE — MAU Provider Note (Signed)
Pt was transferred as her care could not be ascertained by telephone. She was dx'd with a simple cyst and could not get pain relief at [facility].  She was eval and found to be appropriately managed by pain meds.  She was given outpt f/u and discharged. Attestation of Attending Supervision of Advanced Practitioner (CNM/NP): Evaluation and management procedures were performed by the Advanced Practitioner under my supervision and collaboration.  I have reviewed the Advanced Practitioner's note and chart, and I agree with the management and plan.  HARRAWAY-SMITH, Shareeka Yim 9:23 AM

## 2014-02-10 NOTE — ED Provider Notes (Signed)
Medical screening examination/treatment/procedure(s) were performed by non-physician practitioner and as supervising physician I was immediately available for consultation/collaboration.   EKG Interpretation None        Cindia Hustead T Holly Pring, MD 02/10/14 1644 

## 2014-02-10 NOTE — MAU Note (Signed)
Arrival via [facility]. Pt reports pain 7/10 now.

## 2014-02-10 NOTE — ED Notes (Signed)
Pt currently in US.

## 2014-02-10 NOTE — MAU Provider Note (Signed)
History     CSN: [id number]  Arrival date and time: 02/09/14 [id number]   First Provider Initiated Contact with Patient 02/10/14 0532      Chief Complaint  Patient presents with  . Abdominal Pain   HPI Ms. Margaret FlamingCrystal Lucero is a 28 y.o. [phone] with history of C/S and left ovarian simple cyst diagnosed on 02/05/14 who [facility]. The patient states that she has been taking Motrin and Tylenol at home with little relief of pain. She states that at worst tonight her pain was 8/10. Ovarian cyst was 3.5 cm on 02/05/14 and appears approximately the same size on repeat US from [facility] tonight. She states now it is 6/10. [facility] given 1 tablet Percocet, 4 mg Morphine IV x 2, 30 mg Toradol IV x 2, 1 mg Dilaudid IV x 2 and 0.5 mg Dilaudid IV x 1. Patient states LMP 02/06/14. She states that bleeding is heavier than normal. She also states that she has had inter-menstrual bleeding over the last 2 months. The patient is on LoEstrin OCPs since May 2015. She states that she was prescribed the OCPs by PCP in [place] at [facility]. She states that they also did her last pap smear which was normal in July 2015.   OB History   Grav Para Term Preterm Abortions TAB SAB Ect Mult Living                  Past Medical History  Diagnosis Date  . Asthma     Past Surgical History  Procedure Laterality Date  . Hernia repair    . Cesarean section    . Tendon repair      History reviewed. No pertinent family history.  History  Substance Use Topics  . Smoking status: Never Smoker   . Smokeless tobacco: Not on file  . Alcohol Use: Yes    Allergies:  Allergies  Allergen Reactions  . Shellfish Allergy Hives and Shortness Of Breath  . Iodine Hives    Prescriptions prior to admission  Medication Sig Dispense Refill  . acetaminophen (TYLENOL) 500 MG tablet Take 500 mg by mouth every 6 (six) hours as needed.      Marland Kitchen. albuterol (PROVENTIL HFA;VENTOLIN HFA)  108 (90 BASE) MCG/ACT inhaler Inhale 2 puffs into the lungs every 6 (six) hours as needed for wheezing or shortness of breath.      Marland Kitchen. amoxicillin (AMOXIL) 500 MG capsule Take 500 mg by mouth 3 (three) times daily. Completed course of medication sometime last week from today (02-09-14) per patient        Review of Systems  Constitutional: Negative for fever and malaise/fatigue.  Gastrointestinal: Positive for abdominal pain.  Genitourinary:       + vaginal bleeding   Physical Exam   Blood pressure 122/65, pulse 80, temperature 98.5 F (36.9 C), temperature source Oral, resp. rate 18, height 5' (1.524 m), weight 146 lb (66.225 kg), last menstrual period 02/06/2014, SpO2 100.00%.  Physical Exam  Constitutional: She is oriented to person, place, and time. She appears well-developed and well-nourished. No distress.  HENT:  Head: Normocephalic.  Cardiovascular: Normal rate.   Respiratory: Effort normal.  GI: Soft. She exhibits no distension and no mass. There is tenderness (mild tenderness to palpation of the lower abdomen, more prominent in the suprapubic region and LLQ). There is no rebound and no guarding.  Neurological: She is alert and oriented to person,  place, and time.  Skin: Skin is warm and dry. No erythema.  Psychiatric: She has a normal mood and affect.   Results for orders placed during the hospital encounter of 02/09/14 (from the past 24 hour(s))  CBC WITH DIFFERENTIAL     Status: None   Collection Time    02/09/14 10:07 PM      Result Value Ref Range   WBC 5.7  4.0 - 10.5 K/uL   RBC 4.30  3.87 - 5.11 MIL/uL   Hemoglobin 12.9  12.0 - 15.0 g/dL   HCT 57.8  46.9 - 62.9 %   MCV 87.0  78.0 - 100.0 fL   MCH 30.0  26.0 - 34.0 pg   MCHC 34.5  30.0 - 36.0 g/dL   RDW 52.8  41.3 - 24.4 %   Platelets 235  150 - 400 K/uL   Neutrophils Relative % 59  43 - 77 %   Neutro Abs 3.4  1.7 - 7.7 K/uL   Lymphocytes Relative 31  12 - 46 %   Lymphs Abs 1.8  0.7 - 4.0 K/uL   Monocytes  Relative 9  3 - 12 %   Monocytes Absolute 0.5  0.1 - 1.0 K/uL   Eosinophils Relative 1  0 - 5 %   Eosinophils Absolute 0.1  0.0 - 0.7 K/uL   Basophils Relative 0  0 - 1 %   Basophils Absolute 0.0  0.0 - 0.1 K/uL  I-STAT CHEM 8, ED     Status: Abnormal   Collection Time    02/09/14 10:32 PM      Result Value Ref Range   Sodium 137  137 - 147 mEq/L   Potassium 3.7  3.7 - 5.3 mEq/L   Chloride 109  96 - 112 mEq/L   BUN 9  6 - 23 mg/dL   Creatinine, Ser 0.10  0.50 - 1.10 mg/dL   Glucose, Bld 96  70 - 99 mg/dL   Calcium, Ion 2.72 (*) 1.12 - 1.23 mmol/L   TCO2 22  0 - 100 mmol/L   Hemoglobin 13.6  12.0 - 15.0 g/dL   HCT 53.6  64.4 - 03.4 %  URINALYSIS, ROUTINE W REFLEX MICROSCOPIC     Status: Abnormal   Collection Time    02/09/14 10:40 PM      Result Value Ref Range   Color, Urine YELLOW  YELLOW   APPearance CLEAR  CLEAR   Specific Gravity, Urine 1.024  1.005 - 1.030   pH 6.0  5.0 - 8.0   Glucose, UA NEGATIVE  NEGATIVE mg/dL   Hgb urine dipstick LARGE (*) NEGATIVE   Bilirubin Urine NEGATIVE  NEGATIVE   Ketones, ur NEGATIVE  NEGATIVE mg/dL   Protein, ur NEGATIVE  NEGATIVE mg/dL   Urobilinogen, UA 0.2  0.0 - 1.0 mg/dL   Nitrite NEGATIVE  NEGATIVE   Leukocytes, UA NEGATIVE  NEGATIVE  PREGNANCY, URINE     Status: None   Collection Time    02/09/14 10:40 PM      Result Value Ref Range   Preg Test, Ur NEGATIVE  NEGATIVE  URINE MICROSCOPIC-ADD ON     Status: None   Collection Time    02/09/14 10:40 PM      Result Value Ref Range   Squamous Epithelial / LPF RARE  RARE   WBC, UA 0-2  <3 WBC/hpf   RBC / HPF 11-20  <3 RBC/hpf   Bacteria, UA RARE  RARE   Urine-Other MUCOUS PRESENT  WET PREP, GENITAL     Status: Abnormal   Collection Time    02/09/14 11:04 PM      Result Value Ref Range   Yeast Wet Prep HPF POC NONE SEEN  NONE SEEN   Trich, Wet Prep NONE SEEN  NONE SEEN   Clue Cells Wet Prep HPF POC FEW (*) NONE SEEN   WBC, Wet Prep HPF POC FEW (*) NONE SEEN  I-STAT CG4  LACTIC ACID, ED     Status: None   Collection Time    02/10/14  2:43 AM      Result Value Ref Range   Lactic Acid, Venous 1.48  0.5 - 2.2 mmol/L   [place] Transvaginal Non-ob  02/10/2014   CLINICAL DATA:  Persistent left lower quadrant abdominal pain. Assess for ovarian torsion. Follow-up abdominal pain.  EXAM: TRANSABDOMINAL AND TRANSVAGINAL ULTRASOUND OF PELVIS  DOPPLER ULTRASOUND OF OVARIES  TECHNIQUE: Both transabdominal and transvaginal ultrasound examinations of the pelvis were performed. Transabdominal technique was performed for global imaging of the pelvis including uterus, ovaries, adnexal regions, and pelvic cul-de-sac.  It was necessary to proceed with endovaginal exam following the transabdominal exam to visualize the uterus and ovaries in greater detail. Color and duplex Doppler ultrasound was utilized to evaluate blood flow to the ovaries.  COMPARISON:  Pelvic ultrasound performed 02/05/2014  FINDINGS: Uterus  Measurements: 8.4 x 4.0 x 5.4 cm. No fibroids or other mass visualized.  Endometrium  Thickness: 0.4 cm.  No focal abnormality visualized.  Right ovary  Measurements: 3.9 x 2.4 x 1.5 cm. Normal appearance/no adnexal mass.  Left ovary  Measurements: 4.5 x 4.3 x 4.0 cm. A 4.2 x 3.4 x 3.1 cm simple cyst is noted at the left ovary.  Pulsed Doppler evaluation of both ovaries demonstrates normal low-resistance arterial and venous waveforms.  Other findings  A small amount of particulate ascites is noted within the pelvic cul-de-sac.  IMPRESSION: 1. Uterus unremarkable in appearance. No evidence of ovarian torsion. 2. 4.2 cm left ovarian cyst is likely physiologic in nature. 3. Small amount of particulate ascites within the pelvic cul-de-sac.   Electronically Signed   By: Roanna RaiderJeffery  Chang M.D.   On: 02/10/2014 05:17   [place] Pelvis Complete  02/10/2014   CLINICAL DATA:  Persistent left lower quadrant abdominal pain. Assess for ovarian torsion. Follow-up abdominal pain.  EXAM: TRANSABDOMINAL AND  TRANSVAGINAL ULTRASOUND OF PELVIS  DOPPLER ULTRASOUND OF OVARIES  TECHNIQUE: Both transabdominal and transvaginal ultrasound examinations of the pelvis were performed. Transabdominal technique was performed for global imaging of the pelvis including uterus, ovaries, adnexal regions, and pelvic cul-de-sac.  It was necessary to proceed with endovaginal exam following the transabdominal exam to visualize the uterus and ovaries in greater detail. Color and duplex Doppler ultrasound was utilized to evaluate blood flow to the ovaries.  COMPARISON:  Pelvic ultrasound performed 02/05/2014  FINDINGS: Uterus  Measurements: 8.4 x 4.0 x 5.4 cm. No fibroids or other mass visualized.  Endometrium  Thickness: 0.4 cm.  No focal abnormality visualized.  Right ovary  Measurements: 3.9 x 2.4 x 1.5 cm. Normal appearance/no adnexal mass.  Left ovary  Measurements: 4.5 x 4.3 x 4.0 cm. A 4.2 x 3.4 x 3.1 cm simple cyst is noted at the left ovary.  Pulsed Doppler evaluation of both ovaries demonstrates normal low-resistance arterial and venous waveforms.  Other findings  A small amount of particulate ascites is noted within the pelvic cul-de-sac.  IMPRESSION: 1. Uterus unremarkable in appearance. No evidence of ovarian  torsion. 2. 4.2 cm left ovarian cyst is likely physiologic in nature. 3. Small amount of particulate ascites within the pelvic cul-de-sac.   Electronically Signed   By: Roanna Raider M.D.   On: 02/10/2014 05:17   [place] Art/ven Flow Abd Pelv Doppler  02/10/2014   CLINICAL DATA:  Persistent left lower quadrant abdominal pain. Assess for ovarian torsion. Follow-up abdominal pain.  EXAM: TRANSABDOMINAL AND TRANSVAGINAL ULTRASOUND OF PELVIS  DOPPLER ULTRASOUND OF OVARIES  TECHNIQUE: Both transabdominal and transvaginal ultrasound examinations of the pelvis were performed. Transabdominal technique was performed for global imaging of the pelvis including uterus, ovaries, adnexal regions, and pelvic cul-de-sac.  It was necessary to  proceed with endovaginal exam following the transabdominal exam to visualize the uterus and ovaries in greater detail. Color and duplex Doppler ultrasound was utilized to evaluate blood flow to the ovaries.  COMPARISON:  Pelvic ultrasound performed 02/05/2014  FINDINGS: Uterus  Measurements: 8.4 x 4.0 x 5.4 cm. No fibroids or other mass visualized.  Endometrium  Thickness: 0.4 cm.  No focal abnormality visualized.  Right ovary  Measurements: 3.9 x 2.4 x 1.5 cm. Normal appearance/no adnexal mass.  Left ovary  Measurements: 4.5 x 4.3 x 4.0 cm. A 4.2 x 3.4 x 3.1 cm simple cyst is noted at the left ovary.  Pulsed Doppler evaluation of both ovaries demonstrates normal low-resistance arterial and venous waveforms.  Other findings  A small amount of particulate ascites is noted within the pelvic cul-de-sac.  IMPRESSION: 1. Uterus unremarkable in appearance. No evidence of ovarian torsion. 2. 4.2 cm left ovarian cyst is likely physiologic in nature. 3. Small amount of particulate ascites within the pelvic cul-de-sac.   Electronically Signed   By: Roanna Raider M.D.   On: 02/10/2014 05:17    MAU Course  Procedures None  MDM Labs and [place] from [facility] reviewed Simple cyst noted again today No evidence of ovarian torsion Labs are stable Discussed patient with Dr. Erin Fulling. Ok for discharge with Rx for Percocet and follow-up in [facility] in ~ 2 weeks Assessment and Plan  A: Ovarian cyst Dysmenorrhea  P: Discharge home Rx for Percocet given to patient Patient given follow-up appointment with [facility] on 02/20/14 at 1:00 pm Patient may return to MAU as needed or if her condition were to change or worsen   Marny Lowenstein, PA-C  02/10/2014, 5:32 AM

## 2014-02-10 NOTE — Discharge Instructions (Signed)
Dysmenorrhea °Menstrual cramps (dysmenorrhea) are caused by the muscles of the uterus tightening (contracting) during a menstrual period. For some women, this discomfort is merely bothersome. For others, dysmenorrhea can be severe enough to interfere with everyday activities for a few days each month. °Primary dysmenorrhea is menstrual cramps that last a couple of days when you start having menstrual periods or soon after. This often begins after a teenager starts having her period. As a woman gets older or has a baby, the cramps will usually lessen or disappear. Secondary dysmenorrhea begins later in life, lasts longer, and the pain may be stronger than primary dysmenorrhea. The pain may start before the period and last a few days after the period.  °CAUSES  °Dysmenorrhea is usually caused by an underlying problem, such as: °· The tissue lining the uterus grows outside of the uterus in other areas of the body (endometriosis). °· The endometrial tissue, which normally lines the uterus, is found in or grows into the muscular walls of the uterus (adenomyosis). °· The pelvic blood vessels are engorged with blood just before the menstrual period (pelvic congestive syndrome). °· Overgrowth of cells (polyps) in the lining of the uterus or cervix. °· Falling down of the uterus (prolapse) because of loose or stretched ligaments. °· Depression. °· Bladder problems, infection, or inflammation. °· Problems with the intestine, a tumor, or irritable bowel syndrome. °· Cancer of the female organs or bladder. °· A severely tipped uterus. °· A very tight opening or closed cervix. °· Noncancerous tumors of the uterus (fibroids). °· Pelvic inflammatory disease (PID). °· Pelvic scarring (adhesions) from a previous surgery. °· Ovarian cyst. °· An intrauterine device (IUD) used for birth control. °RISK FACTORS °You may be at greater risk of dysmenorrhea if: °· You are younger than age 30. °· You started puberty early. °· You have  irregular or heavy bleeding. °· You have never given birth. °· You have a family history of this problem. °· You are a smoker. °SIGNS AND SYMPTOMS  °· Cramping or throbbing pain in your lower abdomen. °· Headaches. °· Lower back pain. °· Nausea or vomiting. °· Diarrhea. °· Sweating or dizziness. °· Loose stools. °DIAGNOSIS  °A diagnosis is based on your history, symptoms, physical exam, diagnostic tests, or procedures. Diagnostic tests or procedures may include: °· Blood tests. °· Ultrasonography. °· An examination of the lining of the uterus (dilation and curettage, D&C). °· An examination inside your abdomen or pelvis with a scope (laparoscopy). °· X-rays. °· CT scan. °· MRI. °· An examination inside the bladder with a scope (cystoscopy). °· An examination inside the intestine or stomach with a scope (colonoscopy, gastroscopy). °TREATMENT  °Treatment depends on the cause of the dysmenorrhea. Treatment may include: °· Pain medicine prescribed by your health care provider. °· Birth control pills or an IUD with progesterone hormone in it. °· Hormone replacement therapy. °· Nonsteroidal anti-inflammatory drugs (NSAIDs). These may help stop the production of prostaglandins. °· Surgery to remove adhesions, endometriosis, ovarian cyst, or fibroids. °· Removal of the uterus (hysterectomy). °· Progesterone shots to stop the menstrual period. °· Cutting the nerves on the sacrum that go to the female organs (presacral neurectomy). °· Electric current to the sacral nerves (sacral nerve stimulation). °· Antidepressant medicine. °· Psychiatric therapy, counseling, or group therapy. °· Exercise and physical therapy. °· Meditation and yoga therapy. °· Acupuncture. °HOME CARE INSTRUCTIONS  °· Only take over-the-counter or prescription medicines as directed by your health care provider. °· Place a heating pad   or hot water bottle on your lower back or abdomen. Do not sleep with the heating pad.  Use aerobic exercises, walking,  swimming, biking, and other exercises to help lessen the cramping.  Massage to the lower back or abdomen may help.  Stop smoking.  Avoid alcohol and caffeine. SEEK MEDICAL CARE IF:   Your pain does not get better with medicine.  You have pain with sexual intercourse.  Your pain increases and is not controlled with medicines.  You have abnormal vaginal bleeding with your period.  You develop nausea or vomiting with your period that is not controlled with medicine. SEEK IMMEDIATE MEDICAL CARE IF:  You pass out.  Document Released: 04/21/2005 Document Revised: 12/22/2012 Document Reviewed: 10/07/2012 [facility] Patient Information 2015 [employer] [place]. This information is not intended to replace advice given to you by your health care provider. Make sure you discuss any questions you have with your health care provider. Ovarian Cyst An ovarian cyst is a sac filled with fluid or blood. This sac is attached to the ovary. Some cysts go away on their own. Other cysts need treatment.  HOME CARE   Only take medicine as told by your doctor.  Follow up with your doctor as told.  Get regular pelvic exams and Pap tests. GET HELP IF:  Your periods are late, not regular, or painful.  You stop having periods.  Your belly (abdominal) or pelvic pain does not go away.  Your belly becomes large or puffy (swollen).  You have a hard time peeing (totally emptying your bladder).  You have pressure on your bladder.  You have pain during sex.  You feel fullness, pressure, or discomfort in your belly.  You lose weight for no reason.  You feel sick most of the time.  You have a hard time pooping (constipation).  You do not feel like eating.  You develop pimples (acne).  You have an increase in hair on your body and face.  You are gaining weight for no reason.  You think you are pregnant. GET HELP RIGHT AWAY IF:   Your belly pain gets worse.  You feel sick to your stomach  (nauseous), and you throw up (vomit).  You have a fever that comes on fast.  You have belly pain while pooping (bowel movement).  Your periods are heavier than usual. MAKE SURE YOU:   Understand these instructions.  Will watch your condition.  Will get help right away if you are not doing well or get worse. Document Released: 10/08/2007 Document Revised: 02/09/2013 Document Reviewed: 12/27/2012 [facility] Patient Information 2015 [place], [place]. This information is not intended to replace advice given to you by your health care provider. Make sure you discuss any questions you have with your health care provider.

## 2014-02-11 LAB — GC/CHLAMYDIA PROBE AMP
CT Probe RNA: NEGATIVE
GC PROBE AMP APTIMA: NEGATIVE

## 2014-02-13 ENCOUNTER — Ambulatory Visit: Payer: 59 | Admitting: Licensed Clinical Social Worker

## 2014-02-14 ENCOUNTER — Ambulatory Visit (INDEPENDENT_AMBULATORY_CARE_PROVIDER_SITE_OTHER): Payer: 59 | Admitting: Licensed Clinical Social Worker

## 2014-02-14 DIAGNOSIS — F3341 Major depressive disorder, recurrent, in partial remission: Secondary | ICD-10-CM

## 2014-02-20 ENCOUNTER — Ambulatory Visit: Payer: Self-pay | Admitting: Licensed Clinical Social Worker

## 2014-02-20 ENCOUNTER — Ambulatory Visit: Payer: 59 | Admitting: Obstetrics & Gynecology

## 2014-02-20 ENCOUNTER — Telehealth: Payer: Self-pay

## 2014-02-20 NOTE — Telephone Encounter (Signed)
Patient missed MAU follow up today. Called patient who states she had appointment with her regular OBGYN last Friday and again next Monday for follow up. Informed patient that this was OK. No need for appointment today.

## 2014-02-28 ENCOUNTER — Ambulatory Visit: Payer: 59 | Admitting: Psychology

## 2014-03-02 ENCOUNTER — Ambulatory Visit (INDEPENDENT_AMBULATORY_CARE_PROVIDER_SITE_OTHER): Payer: 59 | Admitting: Psychology

## 2014-03-02 ENCOUNTER — Ambulatory Visit (INDEPENDENT_AMBULATORY_CARE_PROVIDER_SITE_OTHER): Payer: 59 | Admitting: Licensed Clinical Social Worker

## 2014-03-02 DIAGNOSIS — F4323 Adjustment disorder with mixed anxiety and depressed mood: Secondary | ICD-10-CM

## 2014-03-02 DIAGNOSIS — F3341 Major depressive disorder, recurrent, in partial remission: Secondary | ICD-10-CM

## 2014-03-07 ENCOUNTER — Encounter (HOSPITAL_COMMUNITY): Payer: Self-pay | Admitting: *Deleted

## 2014-03-16 ENCOUNTER — Ambulatory Visit: Payer: 59 | Admitting: Licensed Clinical Social Worker

## 2014-03-16 ENCOUNTER — Ambulatory Visit (INDEPENDENT_AMBULATORY_CARE_PROVIDER_SITE_OTHER): Payer: 59 | Admitting: Psychology

## 2014-03-16 DIAGNOSIS — F4323 Adjustment disorder with mixed anxiety and depressed mood: Secondary | ICD-10-CM

## 2014-03-29 ENCOUNTER — Ambulatory Visit (INDEPENDENT_AMBULATORY_CARE_PROVIDER_SITE_OTHER): Payer: 59 | Admitting: Licensed Clinical Social Worker

## 2014-03-29 DIAGNOSIS — F4323 Adjustment disorder with mixed anxiety and depressed mood: Secondary | ICD-10-CM

## 2014-04-18 ENCOUNTER — Ambulatory Visit: Payer: 59 | Admitting: Licensed Clinical Social Worker

## 2014-05-09 ENCOUNTER — Ambulatory Visit: Payer: 59 | Admitting: Licensed Clinical Social Worker

## 2014-05-23 ENCOUNTER — Ambulatory Visit: Payer: 59 | Admitting: Licensed Clinical Social Worker

## 2014-05-30 ENCOUNTER — Ambulatory Visit (INDEPENDENT_AMBULATORY_CARE_PROVIDER_SITE_OTHER): Payer: BLUE CROSS/BLUE SHIELD | Admitting: Licensed Clinical Social Worker

## 2014-05-30 DIAGNOSIS — F4323 Adjustment disorder with mixed anxiety and depressed mood: Secondary | ICD-10-CM

## 2014-06-22 ENCOUNTER — Ambulatory Visit (INDEPENDENT_AMBULATORY_CARE_PROVIDER_SITE_OTHER): Payer: BLUE CROSS/BLUE SHIELD | Admitting: Psychology

## 2014-06-22 DIAGNOSIS — F4323 Adjustment disorder with mixed anxiety and depressed mood: Secondary | ICD-10-CM

## 2014-06-29 ENCOUNTER — Ambulatory Visit (INDEPENDENT_AMBULATORY_CARE_PROVIDER_SITE_OTHER): Payer: BLUE CROSS/BLUE SHIELD | Admitting: Licensed Clinical Social Worker

## 2014-06-29 DIAGNOSIS — F4323 Adjustment disorder with mixed anxiety and depressed mood: Secondary | ICD-10-CM

## 2014-07-20 ENCOUNTER — Ambulatory Visit (INDEPENDENT_AMBULATORY_CARE_PROVIDER_SITE_OTHER): Payer: BLUE CROSS/BLUE SHIELD | Admitting: Psychology

## 2014-07-20 DIAGNOSIS — F4323 Adjustment disorder with mixed anxiety and depressed mood: Secondary | ICD-10-CM

## 2014-08-08 ENCOUNTER — Ambulatory Visit: Payer: BLUE CROSS/BLUE SHIELD | Admitting: Licensed Clinical Social Worker

## 2014-08-15 ENCOUNTER — Ambulatory Visit (INDEPENDENT_AMBULATORY_CARE_PROVIDER_SITE_OTHER): Payer: BLUE CROSS/BLUE SHIELD | Admitting: Licensed Clinical Social Worker

## 2014-08-15 DIAGNOSIS — F4323 Adjustment disorder with mixed anxiety and depressed mood: Secondary | ICD-10-CM | POA: Diagnosis not present

## 2014-08-17 ENCOUNTER — Ambulatory Visit (INDEPENDENT_AMBULATORY_CARE_PROVIDER_SITE_OTHER): Payer: BLUE CROSS/BLUE SHIELD | Admitting: Psychology

## 2014-08-17 DIAGNOSIS — F4323 Adjustment disorder with mixed anxiety and depressed mood: Secondary | ICD-10-CM

## 2014-08-21 ENCOUNTER — Encounter: Payer: Self-pay | Admitting: *Deleted

## 2014-08-21 NOTE — Progress Notes (Signed)
Patient ID: Margaret Lucero, female   DOB: Apr 06, 1986, 29 y.o.   MRN: [id number]     Cardiology Office Note   Date:  08/21/2014   ID:  Margaret Lucero, DOB 11/13/1985, MRN [id number]  PCP:  Jackie Plum, MD  Cardiologist:   Charlton Haws, MD   No chief complaint on file.     History of Present Illness: Margaret Lucero is a 29 y.o. female who presents for evaluation of dizzyness  She is in her 2nd trimester.  Seen by Shea Evans on 4/16  Complained of dizziness and lightheadedness when sitting or driving.  History of palpitations during previous pregnancy but no w/u done In OB/Office some dizzyness with standing but not postural HR increased 20 points.  She has had years of "fluttering" in chest no documented arrhythmia.  Dizziness last couple of months Some positional not always associated with palpitations  No high risk family history Has a healthy 61/29 year old 5th pregnancy other than needing C sections no issues.  Not diabetic , no smoking or drugs.  No other current issues with pregnancy and baby boy ok by [place]  Labs reviewed Hct 39.5  UA negative   Past Medical History  Diagnosis Date  . Asthma     Past Surgical History  Procedure Laterality Date  . Hernia repair    . Cesarean section    . Tendon repair       Current Outpatient Prescriptions  Medication Sig Dispense Refill  . acetaminophen (TYLENOL) 500 MG tablet Take 500 mg by mouth every 6 (six) hours as needed.    Marland Kitchen albuterol (PROVENTIL HFA;VENTOLIN HFA) 108 (90 BASE) MCG/ACT inhaler Inhale 2 puffs into the lungs every 6 (six) hours as needed for wheezing or shortness of breath.    Marland Kitchen amoxicillin (AMOXIL) 500 MG capsule Take 500 mg by mouth 3 (three) times daily. Completed course of medication sometime last week from today (02-09-14) per patient    . oxyCODONE-acetaminophen (PERCOCET/ROXICET) 5-325 MG per tablet Take 1-2 tablets by mouth every 4 (four) hours as needed for severe pain. 20 tablet 0   No current  facility-administered medications for this visit.    Allergies:   Shellfish allergy and Iodine    Social History:  The patient  reports that she has never smoked. She does not have any smokeless tobacco history on file. She reports that she drinks alcohol. She reports that she does not use illicit drugs.   Family History:  The patient's  Grandfather with MI in 60's    ROS:  Please see the history of present illness.   Otherwise, review of systems are positive for none.   All other systems are reviewed and negative.    PHYSICAL EXAM: VS:  There were no vitals taken for this visit. , BMI There is no weight on file to calculate BMI. GEN: Well nourished, well developed, in no acute distress HEENT: normal Neck: no JVD, carotid bruits, or masses Cardiac: RRR; no murmurs, rubs, or gallops,no edema  Respiratory:  clear to auscultation bilaterally, normal work of breathing GI: soft, nontender, nondistended, + BS MS: no deformity or atrophy Skin: warm and dry, no rash Neuro:  Strength and sensation are intact Psych: euthymic mood, full affect   EKG:  EKG  08/23/14  SR rate 100 normal    Recent Labs: 02/05/2014: ALT 11 02/09/2014: BUN 9; Creatinine 0.60; Hemoglobin 13.6; Platelets 235; Potassium 3.7; Sodium 137    Lipid Panel No results found for: CHOL, TRIG, HDL, CHOLHDL,  VLDL, LDLCALC, LDLDIRECT    Wt Readings from Last 3 Encounters:  02/09/14 146 lb (66.225 kg)      Other studies Reviewed: Additional studies/ records that were reviewed today include: OB/GYN notes and labs . Review of the above records demonstrates: see HPI   ASSESSMENT AND PLAN:  1.  Dizziness:  Doubt cardiac etiology Normal exam and ECG  F/u echo Agree with positional recommendations and staying hydrated 2. Palpitations:  Benign sounding f/u event monitor while having symptoms 3. Pregnancy  Having another C section glucose tolerance test scheduled f/u OB   Current medicines are reviewed at length with  the patient today.  The patient does not have concerns regarding medicines.  The following changes have been made:  no change  Labs/ tests ordered today include:  Event monitor and Echo  No orders of the defined types were placed in this encounter.     Disposition:   FU with me  PRN    Signed, Charlton HawsPeter Roxie Gueye, MD  08/21/2014 2:27 PM    [facility] [address], [place], [place]  [address] Phone: [phone]; Fax: [phone]

## 2014-08-23 ENCOUNTER — Encounter: Payer: Self-pay | Admitting: Cardiovascular Disease

## 2014-08-23 ENCOUNTER — Ambulatory Visit (INDEPENDENT_AMBULATORY_CARE_PROVIDER_SITE_OTHER): Payer: BLUE CROSS/BLUE SHIELD | Admitting: Cardiovascular Disease

## 2014-08-23 VITALS — BP 110/72 | HR 100 | Ht 60.0 in | Wt 168.4 lb

## 2014-08-23 DIAGNOSIS — R002 Palpitations: Secondary | ICD-10-CM | POA: Diagnosis not present

## 2014-08-23 DIAGNOSIS — R55 Syncope and collapse: Secondary | ICD-10-CM | POA: Diagnosis not present

## 2014-08-23 NOTE — Patient Instructions (Signed)
Medication Instructions:   NO CHANGES  Labwork: NONE  Testing/Procedures: Your physician has recommended that you wear an event monitor. Event monitors are medical devices that record the heart's electrical activity. Doctors most often us these monitors to diagnose arrhythmias. Arrhythmias are problems with the speed or rhythm of the heartbeat. The monitor is a small, portable device. You can wear one while you do your normal daily activities. This is usually used to diagnose what is causing palpitations/syncope (passing out).   Your physician has requested that you have an echocardiogram. Echocardiography is a painless test that uses sound waves to create images of your heart. It provides your doctor with information about the size and shape of your heart and how well your heart's chambers and valves are working. This procedure takes approximately one hour. There are no restrictions for this procedure.    Follow-Up: AS NEEDED Any Other Special Instructions Will Be Listed Below (If Applicable).

## 2014-09-01 ENCOUNTER — Ambulatory Visit (INDEPENDENT_AMBULATORY_CARE_PROVIDER_SITE_OTHER): Payer: BLUE CROSS/BLUE SHIELD

## 2014-09-01 ENCOUNTER — Encounter: Payer: Self-pay | Admitting: Radiology

## 2014-09-01 ENCOUNTER — Ambulatory Visit (HOSPITAL_COMMUNITY): Payer: BLUE CROSS/BLUE SHIELD | Attending: Cardiovascular Disease | Admitting: Radiology

## 2014-09-01 DIAGNOSIS — R002 Palpitations: Secondary | ICD-10-CM

## 2014-09-01 DIAGNOSIS — R55 Syncope and collapse: Secondary | ICD-10-CM

## 2014-09-01 NOTE — Progress Notes (Signed)
Echocardiogram performed.  

## 2014-09-01 NOTE — Progress Notes (Unsigned)
Patient ID: Margaret Lucero, female   DOB: November 19, 1985, 29 y.o.   MRN: [id number] Lifewatch 30 day monitor applied. EOS 10-01-14

## 2014-09-06 ENCOUNTER — Telehealth: Payer: Self-pay

## 2014-09-06 NOTE — Telephone Encounter (Signed)
2D Echocardiogram with contrast  Status: Finalresult Visible to patient:  Not Released Nextappt: None Dx:  Palpitations; Syncope, unspecified sy...       Notes Recorded by Theressa StampsKimberly N Hecker, RN on 09/06/2014 at 4:00 PM Pt made aware of results no questions at this time.    Notes Recorded by Wendall StadePeter C Nishan, MD on 09/04/2014 at 3:55 PM Normal echo Notes Recorded by Wendall StadePeter C Nishan, MD on 09/03/2014 at 9:59 PM Normal echo

## 2014-09-14 ENCOUNTER — Ambulatory Visit: Payer: BLUE CROSS/BLUE SHIELD | Admitting: Psychology

## 2014-10-11 ENCOUNTER — Telehealth: Payer: Self-pay | Admitting: *Deleted

## 2014-10-11 NOTE — Telephone Encounter (Signed)
PT  AWARE OF MONITOR RESULTS  PER  DR NSIHAN SR PAC'S NO SIG  ARRHYTHMIAS./CY

## 2014-10-17 ENCOUNTER — Other Ambulatory Visit: Payer: Self-pay | Admitting: Obstetrics & Gynecology

## 2014-11-17 ENCOUNTER — Encounter (HOSPITAL_COMMUNITY): Payer: Self-pay

## 2014-11-17 ENCOUNTER — Inpatient Hospital Stay (HOSPITAL_COMMUNITY)
Admission: AD | Admit: 2014-11-17 | Discharge: 2014-11-17 | Disposition: A | Discharge status: Home / Self Care | Payer: BLUE CROSS/BLUE SHIELD | Source: Ambulatory Visit | Attending: Obstetrics and Gynecology | Admitting: Obstetrics and Gynecology

## 2014-11-17 DIAGNOSIS — Z3A36 36 weeks gestation of pregnancy: Secondary | ICD-10-CM | POA: Diagnosis not present

## 2014-11-17 DIAGNOSIS — O4703 False labor before 37 completed weeks of gestation, third trimester: Secondary | ICD-10-CM

## 2014-11-17 HISTORY — DX: False labor before 37 completed weeks of gestation, unspecified trimester: O47.00

## 2014-11-17 HISTORY — DX: False labor, unspecified: O47.9

## 2014-11-17 MED ORDER — TERBUTALINE SULFATE 1 MG/ML IJ SOLN
0.2500 mg | Freq: Once | INTRAMUSCULAR | Status: AC
  Administered 2014-11-17: 0.25 mg via SUBCUTANEOUS
  Filled 2014-11-17: qty 1

## 2014-11-17 NOTE — Progress Notes (Signed)
Msg left to return call. 

## 2014-11-17 NOTE — MAU Note (Signed)
Urine in lab 

## 2014-11-17 NOTE — MAU Note (Signed)
Pt sent from office for eval of ctx's q8 minutes apart. Cervix closed in office. C/S scheduled for 12/07/2014.

## 2014-11-17 NOTE — MAU Note (Signed)
No adverse reaction 

## 2014-11-17 NOTE — MAU Provider Note (Signed)
History     CSN: [id number]  Arrival date and time: 11/17/14 0947  Provider notified: 1106 Provider at bedside: 1115    Chief Complaint  Patient presents with  . Contractions   HPI  Ms. Margaret Lucero [age] yo is a [id number] female at 36.[redacted] wks gestation sent from the office for further evaluation of preterm contractions.  She was found to not be dilated.  Per order from Margaret Lucero patient is to receive Terbutaline 0.25 mg SQ. She reports vomiting since this morning.  Past Medical History  Diagnosis Date  . Asthma   . Preterm contractions     2 prior pregnancies    Past Surgical History  Procedure Laterality Date  . Hernia repair    . Cesarean section    . Tendon repair      Family History  Problem Relation Age of Onset  . Cancer Paternal Grandmother     COLON  . Hypertension Paternal Grandfather   . Heart disease Paternal Grandfather   . Depression Mother   . Heart attack Father   . Colon cancer Father   . Asthma Father     History  Substance Use Topics  . Smoking status: Never Smoker   . Smokeless tobacco: Never Used  . Alcohol Use: No    Allergies:  Allergies  Allergen Reactions  . Shellfish Allergy Hives and Shortness Of Breath  . Iodine Hives    Prescriptions prior to admission  Medication Sig Dispense Refill Last Dose  . acetaminophen (TYLENOL) 500 MG tablet Take 1,000 mg by mouth every 6 (six) hours as needed for mild pain or headache.    Past Week at Unknown time  . Prenatal Vit-Fe Fumarate-FA (PRENATAL MULTIVITAMIN) TABS tablet Take 1 tablet by mouth.    11/16/2014 at Unknown time  . ranitidine (ZANTAC) 75 MG tablet Take 75 mg by mouth daily as needed for heartburn.   11/16/2014 at Unknown time  . albuterol (PROVENTIL HFA;VENTOLIN HFA) 108 (90 BASE) MCG/ACT inhaler Inhale 2 puffs into the lungs every 6 (six) hours as needed for wheezing or shortness of breath.   prn    Review of Systems  Constitutional: Negative.   HENT: Negative.   Eyes: Negative.    Respiratory: Negative.   Cardiovascular: Negative.   Gastrointestinal: Positive for nausea and vomiting.       Vomited once this morning   Genitourinary:       Some mild cramping  Musculoskeletal: Negative.   Skin: Negative.   Neurological: Negative.   Endo/Heme/Allergies: Negative.   Psychiatric/Behavioral: Negative.    CEFM FHR: 135 bpm / moderate variability / accels present / no decels TOCO: UI noted Physical Exam   Blood pressure 115/62, pulse 102, temperature 98.2 F (36.8 C), temperature source Oral, resp. rate 16, weight 79.198 kg (174 lb 9.6 oz), last menstrual period 02/06/2014, SpO2 100 %.  Physical Exam  Constitutional: She is oriented to person, place, and time. She appears well-developed and well-nourished.  HENT:  Head: Normocephalic and atraumatic.  Eyes: Pupils are equal, round, and reactive to light.  Neck: Normal range of motion.  Cardiovascular: Normal rate, regular rhythm, normal heart sounds and intact distal pulses.   Respiratory: Effort normal and breath sounds normal.  GI: Soft. Bowel sounds are normal.  Genitourinary:  UC's not palpable; VE: Closed/long/firm/high presentation  Musculoskeletal: Normal range of motion.  Neurological: She is alert and oriented to person, place, and time. She has normal reflexes.  Skin: Skin is warm and dry.  Psychiatric: She has a normal mood and affect. Her behavior is normal. Judgment and thought content normal.    MAU Course  Procedures CEFM Terbutaline 0.25 mg SQ Assessment and Plan  29 yo G5P2022 at 36.[redacted] wks gestation Preterm Contractions  Discharge home Stay well hydrated PTL precautions reviewed Call the office with any further questions, problems or concerns Keep scheduled appointment next week  Kenard Gower MSN, CNM 11/17/2014, 11:27 AM

## 2014-11-17 NOTE — Progress Notes (Signed)
Notified of u/c activity, no new orders, will come see pt.

## 2014-11-17 NOTE — MAU Note (Signed)
Pt states ctx's began around 0700 this am, went to office and cervix closed.

## 2014-11-17 NOTE — MAU Note (Signed)
Contractions started at 0700, now every 8 min.  Sent from office, was not dilated.  No bleeding or leaking, had lost mucous plug.

## 2014-11-17 NOTE — Discharge Instructions (Signed)
Third Trimester of Pregnancy The third trimester is from week 29 through week 42, months 7 through 9. This trimester is when your unborn baby (fetus) is growing very fast. At the end of the ninth month, the unborn baby is about 20 inches in length. It weighs about 6-10 pounds.  HOME CARE   Avoid all smoking, herbs, and alcohol. Avoid drugs not approved by your doctor.  Only take medicine as told by your doctor. Some medicines are safe and some are not during pregnancy.  Exercise only as told by your doctor. Stop exercising if you start having cramps.  Eat regular, healthy meals.  Wear a good support bra if your breasts are tender.  Do not use hot tubs, steam rooms, or saunas.  Wear your seat belt when driving.  Avoid raw meat, uncooked cheese, and liter boxes and soil used by cats.  Take your prenatal vitamins.  Try taking medicine that helps you poop (stool softener) as needed, and if your doctor approves. Eat more fiber by eating fresh fruit, vegetables, and whole grains. Drink enough fluids to keep your pee (urine) clear or pale yellow.  Take warm water baths (sitz baths) to soothe pain or discomfort caused by hemorrhoids. Use hemorrhoid cream if your doctor approves.  If you have puffy, bulging veins (varicose veins), wear support hose. Raise (elevate) your feet for 15 minutes, 3-4 times a day. Limit salt in your diet.  Avoid heavy lifting, wear low heels, and sit up straight.  Rest with your legs raised if you have leg cramps or low back pain.  Visit your dentist if you have not gone during your pregnancy. Use a soft toothbrush to brush your teeth. Be gentle when you floss.  You can have sex (intercourse) unless your doctor tells you not to.  Do not travel far distances unless you must. Only do so with your doctor's approval.  Take prenatal classes.  Practice driving to the hospital.  Pack your hospital bag.  Prepare the baby's room.  Go to your doctor visits. GET  HELP IF:  You are not sure if you are in labor or if your water has broken.  You are dizzy.  You have mild cramps or pressure in your lower belly (abdominal).  You have a nagging pain in your belly area.  You continue to feel sick to your stomach (nauseous), throw up (vomit), or have watery poop (diarrhea).  You have bad smelling fluid coming from your vagina.  You have pain with peeing (urination). GET HELP RIGHT AWAY IF:   You have a fever.  You are leaking fluid from your vagina.  You are spotting or bleeding from your vagina.  You have severe belly cramping or pain.  You lose or gain weight rapidly.  You have trouble catching your breath and have chest pain.  You notice sudden or extreme puffiness (swelling) of your face, hands, ankles, feet, or legs.  You have not felt the baby move in over an hour.  You have severe headaches that do not go away with medicine.  You have vision changes. Document Released: 07/16/2009 Document Revised: 08/16/2012 Document Reviewed: 06/22/2012 ExitCare Patient Information 2015 [employer]. This information is not intended to replace advice given to you by your health care provider. Make sure you discuss any questions you have with your health care provider.  

## 2014-11-27 ENCOUNTER — Inpatient Hospital Stay (HOSPITAL_COMMUNITY)
Admission: RE | Admit: 2014-11-27 | Payer: BLUE CROSS/BLUE SHIELD | Source: Ambulatory Visit | Admitting: Obstetrics & Gynecology

## 2014-11-27 ENCOUNTER — Inpatient Hospital Stay (HOSPITAL_COMMUNITY): Payer: BLUE CROSS/BLUE SHIELD | Admitting: Anesthesiology

## 2014-11-27 ENCOUNTER — Inpatient Hospital Stay (HOSPITAL_COMMUNITY)
Admission: AD | Admit: 2014-11-27 | Discharge: 2014-11-29 | DRG: 766 | Disposition: A | Discharge status: Home / Self Care | Payer: BLUE CROSS/BLUE SHIELD | Source: Ambulatory Visit | Attending: Obstetrics & Gynecology | Admitting: Obstetrics & Gynecology

## 2014-11-27 ENCOUNTER — Encounter (HOSPITAL_COMMUNITY): Payer: Self-pay | Admitting: *Deleted

## 2014-11-27 ENCOUNTER — Encounter (HOSPITAL_COMMUNITY)
Admission: AD | Disposition: A | Discharge status: Home / Self Care | Payer: Self-pay | Source: Ambulatory Visit | Attending: Obstetrics & Gynecology

## 2014-11-27 DIAGNOSIS — Z825 Family history of asthma and other chronic lower respiratory diseases: Secondary | ICD-10-CM

## 2014-11-27 DIAGNOSIS — O9952 Diseases of the respiratory system complicating childbirth: Secondary | ICD-10-CM | POA: Diagnosis present

## 2014-11-27 DIAGNOSIS — Z3A37 37 weeks gestation of pregnancy: Secondary | ICD-10-CM | POA: Diagnosis present

## 2014-11-27 DIAGNOSIS — J45909 Unspecified asthma, uncomplicated: Secondary | ICD-10-CM | POA: Diagnosis present

## 2014-11-27 DIAGNOSIS — Z8249 Family history of ischemic heart disease and other diseases of the circulatory system: Secondary | ICD-10-CM | POA: Diagnosis not present

## 2014-11-27 DIAGNOSIS — O3421 Maternal care for scar from previous cesarean delivery: Principal | ICD-10-CM | POA: Diagnosis present

## 2014-11-27 LAB — TYPE AND SCREEN
ABO/RH(D): O POS
ANTIBODY SCREEN: NEGATIVE

## 2014-11-27 LAB — CBC
HEMATOCRIT: 41.6 % (ref 36.0–46.0)
Hemoglobin: 14.4 g/dL (ref 12.0–15.0)
MCH: 30.3 pg (ref 26.0–34.0)
MCHC: 34.6 g/dL (ref 30.0–36.0)
MCV: 87.4 fL (ref 78.0–100.0)
Platelets: 187 10*3/uL (ref 150–400)
RBC: 4.76 MIL/uL (ref 3.87–5.11)
RDW: 14 % (ref 11.5–15.5)
WBC: 12 10*3/uL — AB (ref 4.0–10.5)

## 2014-11-27 SURGERY — Surgical Case
Anesthesia: Spinal | Site: Abdomen

## 2014-11-27 SURGERY — Surgical Case
Anesthesia: Spinal

## 2014-11-27 MED ORDER — MEPERIDINE HCL 25 MG/ML IJ SOLN: 6.2500 mg | INTRAMUSCULAR | Status: DC | PRN

## 2014-11-27 MED ORDER — SCOPOLAMINE 1 MG/3DAYS TD PT72
MEDICATED_PATCH | TRANSDERMAL | Status: AC
Start: 2014-11-27 — End: 2014-11-27
  Filled 2014-11-27: qty 1

## 2014-11-27 MED ORDER — TERBUTALINE SULFATE 1 MG/ML IJ SOLN
0.2500 mg | Freq: Once | INTRAMUSCULAR | Status: AC
  Administered 2014-11-27: 0.25 mg via SUBCUTANEOUS
  Filled 2014-11-27: qty 1

## 2014-11-27 MED ORDER — MORPHINE SULFATE 0.5 MG/ML IJ SOLN
INTRAMUSCULAR | Status: AC
  Filled 2014-11-27: qty 10

## 2014-11-27 MED ORDER — KETOROLAC TROMETHAMINE 30 MG/ML IJ SOLN
INTRAMUSCULAR | Status: AC
  Filled 2014-11-27: qty 1

## 2014-11-27 MED ORDER — FAMOTIDINE IN NACL 20-0.9 MG/50ML-% IV SOLN
20.0000 mg | Freq: Once | INTRAVENOUS | Status: AC
  Administered 2014-11-27: 20 mg via INTRAVENOUS
  Filled 2014-11-27: qty 50

## 2014-11-27 MED ORDER — FENTANYL CITRATE (PF) 100 MCG/2ML IJ SOLN
INTRAMUSCULAR | Status: AC
  Filled 2014-11-27: qty 2

## 2014-11-27 MED ORDER — NALBUPHINE HCL 10 MG/ML IJ SOLN
INTRAMUSCULAR | Status: AC
  Administered 2014-11-27: 5 mg via SUBCUTANEOUS
  Filled 2014-11-27: qty 1

## 2014-11-27 MED ORDER — CITRIC ACID-SODIUM CITRATE 334-500 MG/5ML PO SOLN
30.0000 mL | Freq: Once | ORAL | Status: AC
  Administered 2014-11-27: 30 mL via ORAL
  Filled 2014-11-27: qty 15

## 2014-11-27 MED ORDER — NALBUPHINE HCL 10 MG/ML IJ SOLN: 5.0000 mg | Freq: Once | INTRAMUSCULAR | Status: AC | PRN

## 2014-11-27 MED ORDER — PHENYLEPHRINE 8 MG IN D5W 100 ML (0.08MG/ML) PREMIX OPTIME
INJECTION | INTRAVENOUS | Status: AC
  Filled 2014-11-27: qty 100

## 2014-11-27 MED ORDER — FENTANYL CITRATE (PF) 100 MCG/2ML IJ SOLN
INTRAMUSCULAR | Status: DC | PRN
  Administered 2014-11-27: 20 ug via INTRAVENOUS

## 2014-11-27 MED ORDER — NALBUPHINE HCL 10 MG/ML IJ SOLN
5.0000 mg | Freq: Once | INTRAMUSCULAR | Status: AC | PRN
  Administered 2014-11-27: 5 mg via SUBCUTANEOUS

## 2014-11-27 MED ORDER — KETOROLAC TROMETHAMINE 30 MG/ML IJ SOLN
30.0000 mg | Freq: Four times a day (QID) | INTRAMUSCULAR | Status: DC | PRN
  Administered 2014-11-27: 30 mg via INTRAVENOUS

## 2014-11-27 MED ORDER — PROMETHAZINE HCL 25 MG/ML IJ SOLN
6.2500 mg | INTRAMUSCULAR | Status: DC | PRN
Start: 2014-11-27 — End: 2014-11-27

## 2014-11-27 MED ORDER — CEFAZOLIN SODIUM-DEXTROSE 2-3 GM-% IV SOLR
2.0000 g | INTRAVENOUS | Status: AC
  Administered 2014-11-27: 2 g via INTRAVENOUS
  Filled 2014-11-27: qty 50

## 2014-11-27 MED ORDER — KETOROLAC TROMETHAMINE 30 MG/ML IJ SOLN: 30.0000 mg | Freq: Four times a day (QID) | INTRAMUSCULAR | Status: DC | PRN

## 2014-11-27 MED ORDER — FENTANYL CITRATE (PF) 100 MCG/2ML IJ SOLN
25.0000 ug | INTRAMUSCULAR | Status: DC | PRN
  Administered 2014-11-27 (×2): 50 ug via INTRAVENOUS

## 2014-11-27 MED ORDER — LACTATED RINGERS IV SOLN
INTRAVENOUS | Status: DC
  Administered 2014-11-27: 19:00:00 via INTRAVENOUS

## 2014-11-27 MED ORDER — PHENYLEPHRINE 8 MG IN D5W 100 ML (0.08MG/ML) PREMIX OPTIME
INJECTION | INTRAVENOUS | Status: DC | PRN
  Administered 2014-11-27: 60 ug/min via INTRAVENOUS

## 2014-11-27 MED ORDER — MORPHINE SULFATE (PF) 0.5 MG/ML IJ SOLN
INTRAMUSCULAR | Status: DC | PRN
  Administered 2014-11-27: .2 mg via EPIDURAL

## 2014-11-27 MED ORDER — LACTATED RINGERS IV SOLN
INTRAVENOUS | Status: DC | PRN
  Administered 2014-11-27 (×2): via INTRAVENOUS

## 2014-11-27 MED ORDER — EPHEDRINE SULFATE 50 MG/ML IJ SOLN
INTRAMUSCULAR | Status: DC | PRN
  Administered 2014-11-27: 5 mg via INTRAVENOUS

## 2014-11-27 MED ORDER — EPHEDRINE SULFATE 50 MG/ML IJ SOLN
INTRAMUSCULAR | Status: AC
  Filled 2014-11-27: qty 1

## 2014-11-27 MED ORDER — OXYTOCIN 10 UNIT/ML IJ SOLN
40.0000 [IU] | INTRAVENOUS | Status: DC | PRN
  Administered 2014-11-27: 40 [IU] via INTRAVENOUS

## 2014-11-27 MED ORDER — ONDANSETRON HCL 4 MG/2ML IJ SOLN
INTRAMUSCULAR | Status: DC | PRN
  Administered 2014-11-27: 4 mg via INTRAVENOUS

## 2014-11-27 MED ORDER — PHENYLEPHRINE 40 MCG/ML (10ML) SYRINGE FOR IV PUSH (FOR BLOOD PRESSURE SUPPORT)
PREFILLED_SYRINGE | INTRAVENOUS | Status: AC
  Filled 2014-11-27: qty 10

## 2014-11-27 MED ORDER — BUPIVACAINE IN DEXTROSE 0.75-8.25 % IT SOLN
INTRATHECAL | Status: DC | PRN
  Administered 2014-11-27: 1.3 mL via INTRATHECAL

## 2014-11-27 MED ORDER — SCOPOLAMINE 1 MG/3DAYS TD PT72
MEDICATED_PATCH | TRANSDERMAL | Status: DC | PRN
  Administered 2014-11-27: 1 via TRANSDERMAL

## 2014-11-27 MED ORDER — FENTANYL CITRATE (PF) 100 MCG/2ML IJ SOLN
INTRAMUSCULAR | Status: AC
  Administered 2014-11-27: 50 ug via INTRAVENOUS
  Filled 2014-11-27: qty 2

## 2014-11-27 MED ORDER — OXYTOCIN 10 UNIT/ML IJ SOLN
INTRAMUSCULAR | Status: AC
  Filled 2014-11-27: qty 4

## 2014-11-27 MED ORDER — ONDANSETRON HCL 4 MG/2ML IJ SOLN
INTRAMUSCULAR | Status: AC
  Filled 2014-11-27: qty 2

## 2014-11-27 SURGICAL SUPPLY — 39 items
BARRIER ADHS 3X4 INTERCEED (GAUZE/BANDAGES/DRESSINGS) ×4 IMPLANT
BENZOIN TINCTURE PRP APPL 2/3 (GAUZE/BANDAGES/DRESSINGS) IMPLANT
CLAMP CORD UMBIL (MISCELLANEOUS) IMPLANT
CLOSURE WOUND 1/2 X4 (GAUZE/BANDAGES/DRESSINGS) ×1
CLOTH BEACON ORANGE TIMEOUT ST (SAFETY) ×4 IMPLANT
CONTAINER PREFILL 10% NBF 15ML (MISCELLANEOUS) IMPLANT
DRAPE SHEET LG 3/4 BI-LAMINATE (DRAPES) IMPLANT
DRSG OPSITE POSTOP 4X10 (GAUZE/BANDAGES/DRESSINGS) ×4 IMPLANT
DURAPREP 26ML APPLICATOR (WOUND CARE) ×4 IMPLANT
ELECT REM PT RETURN 9FT ADLT (ELECTROSURGICAL) ×4
ELECTRODE REM PT RTRN 9FT ADLT (ELECTROSURGICAL) ×2 IMPLANT
EXTRACTOR VACUUM KIWI (MISCELLANEOUS) IMPLANT
EXTRACTOR VACUUM M CUP 4 TUBE (SUCTIONS) IMPLANT
EXTRACTOR VACUUM M CUP 4' TUBE (SUCTIONS)
GLOVE BIO SURGEON STRL SZ7 (GLOVE) ×4 IMPLANT
GLOVE BIOGEL PI IND STRL 7.0 (GLOVE) ×2 IMPLANT
GLOVE BIOGEL PI INDICATOR 7.0 (GLOVE) ×2
GOWN STRL REUS W/TWL LRG LVL3 (GOWN DISPOSABLE) ×8 IMPLANT
KIT ABG SYR 3ML LUER SLIP (SYRINGE) IMPLANT
NEEDLE HYPO 25X5/8 SAFETYGLIDE (NEEDLE) IMPLANT
NS IRRIG 1000ML POUR BTL (IV SOLUTION) ×4 IMPLANT
PACK C SECTION WH (CUSTOM PROCEDURE TRAY) ×4 IMPLANT
PAD ABD 7.5X8 STRL (GAUZE/BANDAGES/DRESSINGS) ×4 IMPLANT
PAD ABD 8X7 1/2 STERILE (GAUZE/BANDAGES/DRESSINGS) ×4 IMPLANT
PAD OB MATERNITY 4.3X12.25 (PERSONAL CARE ITEMS) ×4 IMPLANT
RTRCTR C-SECT PINK 25CM LRG (MISCELLANEOUS) IMPLANT
STRIP CLOSURE SKIN 1/2X4 (GAUZE/BANDAGES/DRESSINGS) ×3 IMPLANT
SUT MON AB-0 CT1 36 (SUTURE) ×12 IMPLANT
SUT PLAIN 0 NONE (SUTURE) IMPLANT
SUT PLAIN 2 0 (SUTURE) ×2
SUT PLAIN ABS 2-0 CT1 27XMFL (SUTURE) ×2 IMPLANT
SUT VIC AB 0 CT1 27 (SUTURE) ×4
SUT VIC AB 0 CT1 27XBRD ANBCTR (SUTURE) ×4 IMPLANT
SUT VIC AB 2-0 CT1 27 (SUTURE) ×6
SUT VIC AB 2-0 CT1 TAPERPNT 27 (SUTURE) ×6 IMPLANT
SUT VIC AB 4-0 KS 27 (SUTURE) ×4 IMPLANT
SUT VICRYL 0 TIES 12 18 (SUTURE) IMPLANT
TOWEL OR 17X24 6PK STRL BLUE (TOWEL DISPOSABLE) ×4 IMPLANT
TRAY FOLEY CATH SILVER 14FR (SET/KITS/TRAYS/PACK) IMPLANT

## 2014-11-27 NOTE — H&P (Signed)
Margaret Lucero is a 29 y.o. female presenting for painful contractions since 1 pm and getting worse. She called office with contractions and decreased FMs. No leaking fluid or bleeding. She is scheduled for 3rd C/s at 39 wks but is here for painful UCs and labor check. She was given Terbutaline x 1 dose at 6 pm and UCs got a bit better for 30 minutes but are now strong and every 3-4 minutes, she breathe hard with them.  Prior C/s x2. G1- term oligo, IOL, C/s for decels in labor 3 cm, G2- decels at 38 wks on NST for labor check, so urgent C/s [facility], Dr Juliene Pina. Labs normal, GBS neg. NT/Ultrascreen, AFP1 normal. Preterm UCs since 30 weeks and FFN negative in Jun'16.  In this preg she had AFI and NST - reactive at 37 wks. She is seen several times in office and once in MAU for UCs at 36 wks and terbutaline helped.   Patient declined tubal ligation.  History OB History    Gravida Para Term Preterm AB TAB SAB Ectopic Multiple Living   Past Medical History  Diagnosis Date  . Asthma   . Preterm contractions     2 prior pregnancies   Past Surgical History  Procedure Laterality Date  . Hernia repair    . Cesarean section    . Tendon repair     Family History: family history includes Asthma in her father; Cancer in her paternal grandmother; Colon cancer in her father; Depression in her mother; Heart attack in her father; Heart disease in her paternal grandfather; Hypertension in her paternal grandfather. Social History:  reports that she has never smoked. She has never used smokeless tobacco. She reports that she does not drink alcohol or use illicit drugs.   Prenatal Transfer Tool  Maternal Diabetes: No Genetic Screening: Normal  Nl Ultrascreen and AFP1 Maternal Ultrasounds/Referrals: Normal Fetal Ultrasounds or other Referrals:  None Maternal Substance Abuse:  No Significant Maternal Medications:  Meds include: Zantac Significant Maternal Lab Results:  Lab  values include: Group B Strep negative Other Comments:   ROS neg    Blood pressure 129/72, pulse 115, temperature 98.3 F (36.8 C), temperature source Oral, resp. rate 18, height 5' (1.524 m), weight 174 lb 9 oz (79.181 kg), last menstrual period 02/06/2014. Exam Physical Exam   A&O x 3, no acute distress. Pleasant HEENT neg, no thyromegaly Lungs CTA bilat CV RRR, A1S2 normal Abdo soft, non tender, non acute Extr no edema/ tenderness Pelvic closed, 50%, soft.  FHT 140s/ + accels/ no decels/ moderate variability, category I  Toco palpable strong 45 sec- 1 min long  Prenatal labs: ABO, Rh:  O(+) Antibody:   neg Rubella:  Imm RPR:   neg x2 HBsAg:   neg HIV:   neg GBS:   neg  Assessment/Plan: 29 yo here for painful UCs, prior C/s x2 at 37.4 wks. Failed terbutaline trial. Proceed with RC/s now.   Risks/complications of surgery reviewed incl infection, bleeding, damage to internal organs including bladder, bowels, ureters, blood vessels, other risks from anesthesia, VTE and delayed complications of any surgery, complications in future surgery reviewed. Also discussed neonatal complications incl difficult delivery, laceration, vacuum assistance, TTN etc. Pt understands and agrees, all concerns addressed.     Margaret Lucero R 11/27/2014, 7:06 PM

## 2014-11-27 NOTE — Anesthesia Postprocedure Evaluation (Signed)
  Anesthesia Post-op Note  Patient: Secondary school teacher) Performed: Procedure(s): CESAREAN SECTION (N/A)  Patient Location: PACU  Anesthesia Type:Spinal  Level of Consciousness: awake and alert   Airway and Oxygen Therapy: Patient Spontanous Breathing  Post-op Pain: mild  Post-op Assessment: Post-op Vital signs reviewed, Patient's Cardiovascular Status Stable, Respiratory Function Stable, Patent Airway and No signs of Nausea or vomiting              Post-op Vital Signs: Reviewed and stable  Last Vitals:  Filed Vitals:   11/27/14 1657  BP: 129/72  Pulse: 115  Temp: 36.8 C  Resp: 18    Complications: No apparent anesthesia complications

## 2014-11-27 NOTE — Anesthesia Preprocedure Evaluation (Signed)
Anesthesia Evaluation  Patient identified by MRN, date of birth, ID band Patient awake    Reviewed: Allergy & Precautions, NPO status , Patient's Chart, lab work & pertinent test results, reviewed documented beta blocker date and time   Airway Mallampati: I   Neck ROM: Full    Dental  (+) Teeth Intact, Dental Advisory Given   Pulmonary asthma (mild) ,  breath sounds clear to auscultation        Cardiovascular negative cardio ROS  Rhythm:Regular     Neuro/Psych    GI/Hepatic negative GI ROS, Neg liver ROS,   Endo/Other  negative endocrine ROS  Renal/GU negative Renal ROS     Musculoskeletal   Abdominal (+)  Abdomen: soft.    Peds  Hematology   Anesthesia Other Findings   Reproductive/Obstetrics                             Anesthesia Physical Anesthesia Plan  ASA: II and emergent  Anesthesia Plan: Spinal   Post-op Pain Management:    Induction:   Airway Management Planned:   Additional Equipment:   Intra-op Plan:   Post-operative Plan:   Informed Consent: I have reviewed the patients History and Physical, chart, labs and discussed the procedure including the risks, benefits and alternatives for the proposed anesthesia with the patient or authorized representative who has indicated his/her understanding and acceptance.     Plan Discussed with:   Anesthesia Plan Comments:         Anesthesia Quick Evaluation

## 2014-11-27 NOTE — Op Note (Signed)
Cesarean Section Procedure Note Margaret Lucero 11/27/2014  Surgery: Repeat Low Transverse Cesarean Section   Indications: Uterine contractions, labor, 37.4 wks   Pre-operative Diagnosis: 37.4 wks, labor    Post-operative Diagnosis: Same   Surgeon: Shea Evans, MD   Assistants: Marlinda Mike, CNM   Anesthesia: spinal   Procedure Details:  The patient was seen in the Holding Room. After few hours of observation and Terbutaline injection, her uterine contractions didn't resolve and got stronger and frequent. So decision was made to proceed with delivery. The risks, benefits, complications, treatment options, and expected outcomes were discussed with the patient. The patient concurred with the proposed plan, giving informed consent. identified as Curator and the procedure verified as Repeat C-Section Delivery. Patient declined tubal ligation.  She was brought to the Operating Room. A Time Out was held and the above information confirmed.  After induction of Spinal anesthesia, the patient was draped and prepped in the usual sterile manner. A  Pfannenstiel incision was made and carried down through the subcutaneous tissue to the fascia. Fascial incision was made and extended transversely. The fascia was separated from the underlying rectus tissue superiorly and inferiorly which were densely adherent and needed some dissection, perforating blood vessels were bleeding and cauterized.  The peritoneum was identified and entered. Peritoneal incision was extended longitudinally carefully due to high adherent bladder peritoneum. The utero-vesical peritoneal reflection was incised transversely and the bladder flap was bluntly freed from the lower uterine segment. Alexis retractor was placed carefully. Lower uterine segment was very thin but no scar dehiscence noted.  A low transverse uterine incision was made. Amniotomy revealed clear amniotic fluid. Delivered from cephalic presentation was a healthy   FEMALE infant at 8.30 pm on 11/27/2014 with Apgar scores of 8 at one minute and 9 at five minutes. Cord ph was not sent the umbilical cord was clamped and cut cord blood was obtained for evaluation. The placenta was removed Intact and appeared normal. The uterine outline, tubes and ovaries appeared normal. The uterine incision was closed with running locked sutures of followed by a second imbricating layer and few figure of eight sutures to control bleeding.   Hemostasis was observed. Alexis retractor was removed after placing Interceed on hysterotomy closure. Peritoneal edges grasped and closed with 2-0 Vicryl after placing another piece of Interceed along the anterior surface of uterine body to prevent adhesions. The fascia was then reapproximated with running sutures of 0Vicryl. The subcuticular closure was performed using 2-0plain gut. The skin was closed with 4-0Vicryl. Steristrips, honeycomb dressing and pressure dressing applied.  Instrument, sponge, and needle counts were correct prior the abdominal closure and were correct at the conclusion of the case.   Findings: Abdominal wall adhesions of rectus muscles to fascia noted. Bladder peritoneum adherent to mid uterine body but could dissected off well and bladder was pushed down well. Thin lower segment noted. Kerr hysterotomy performed. Clear amniotic fluid. Delivered from cephalic presentation was a healthy  FEMALE infant at 8.30 pm on 11/27/2014 with Apgar scores of 8 at one minute and 9 at five minutes. Normal tubes and ovaries. Placenta normal, 3 vessel cord.    Estimated Blood Loss: 650 cc  Total IV Fluids: 2500 ml   Urine Output: 200 cc CC OF clear urine  Specimens: cord blood  Complications: no complications  Disposition: PACU - hemodynamically stable.   Maternal Condition: stable   Baby condition / location:  Couplet care / Skin to Skin  Attending Attestation: I performed  the procedure.   Signed: Surgeon(s): Shea Evans, MD

## 2014-11-27 NOTE — Transfer of Care (Signed)
Immediate Anesthesia Transfer of Care Note  Patient: Curator  Procedure(s) Performed: Procedure(s): CESAREAN SECTION (N/A)  Patient Location: PACU  Anesthesia Type:Spinal  Level of Consciousness: awake, alert , oriented and patient cooperative  Airway & Oxygen Therapy: Patient Spontanous Breathing  Post-op Assessment: Report given to RN and Post -op Vital signs reviewed and stable  Post vital signs: Reviewed and stable  Last Vitals:  Filed Vitals:   11/27/14 1657  BP: 129/72  Pulse: 115  Temp: 36.8 C  Resp: 18    Complications: No apparent anesthesia complications

## 2014-11-27 NOTE — MAU Note (Signed)
Pt C/O uc's that started around 1230 today, are now 4-5 minutes apart, lot of pelvic pressure.  Has been having thin discharge for the last 2 weeks, denies bleeding.

## 2014-11-27 NOTE — Anesthesia Procedure Notes (Signed)
Spinal Patient location during procedure: OR Start time: 11/27/2014 7:55 PM End time: 11/27/2014 8:03 PM Staffing Anesthesiologist: Sebastian Ache Preanesthetic Checklist Completed: patient identified, site marked, surgical consent, pre-op evaluation, timeout performed, IV checked, risks and benefits discussed and monitors and equipment checked Spinal Block Patient position: sitting Prep: ChloraPrep Patient monitoring: continuous pulse ox, blood pressure and heart rate Approach: midline Location: L3-4 Injection technique: single-shot Needle Needle gauge: 24 G Needle length: 9 cm Needle insertion depth: 6 cm Assessment Sensory level: T4 Additional Notes No complications, no paresthesias

## 2014-11-27 NOTE — MAU Note (Signed)
Pt. Taken to OR via stretcher

## 2014-11-28 ENCOUNTER — Encounter (HOSPITAL_COMMUNITY): Payer: Self-pay | Admitting: *Deleted

## 2014-11-28 LAB — CBC
HCT: 37.5 % (ref 36.0–46.0)
Hemoglobin: 12.7 g/dL (ref 12.0–15.0)
MCH: 29.7 pg (ref 26.0–34.0)
MCHC: 33.9 g/dL (ref 30.0–36.0)
MCV: 87.8 fL (ref 78.0–100.0)
Platelets: 168 10*3/uL (ref 150–400)
RBC: 4.27 MIL/uL (ref 3.87–5.11)
RDW: 14.2 % (ref 11.5–15.5)
WBC: 10.4 10*3/uL (ref 4.0–10.5)

## 2014-11-28 LAB — ABO/RH: ABO/RH(D): O POS

## 2014-11-28 LAB — RPR: RPR Ser Ql: NONREACTIVE

## 2014-11-28 MED ORDER — WITCH HAZEL-GLYCERIN EX PADS: 1.0000 "application " | MEDICATED_PAD | CUTANEOUS | Status: DC | PRN

## 2014-11-28 MED ORDER — IBUPROFEN 600 MG PO TABS
600.0000 mg | ORAL_TABLET | Freq: Four times a day (QID) | ORAL | Status: DC
  Administered 2014-11-28 – 2014-11-29 (×6): 600 mg via ORAL
  Filled 2014-11-28 (×6): qty 1

## 2014-11-28 MED ORDER — NALBUPHINE HCL 10 MG/ML IJ SOLN: 5.0000 mg | INTRAMUSCULAR | Status: DC | PRN

## 2014-11-28 MED ORDER — SIMETHICONE 80 MG PO CHEW
80.0000 mg | CHEWABLE_TABLET | Freq: Three times a day (TID) | ORAL | Status: DC
  Administered 2014-11-28 – 2014-11-29 (×4): 80 mg via ORAL
  Filled 2014-11-28 (×4): qty 1

## 2014-11-28 MED ORDER — NALOXONE HCL 0.4 MG/ML IJ SOLN: 0.4000 mg | INTRAMUSCULAR | Status: DC | PRN

## 2014-11-28 MED ORDER — SIMETHICONE 80 MG PO CHEW: 80.0000 mg | CHEWABLE_TABLET | ORAL | Status: DC | PRN

## 2014-11-28 MED ORDER — DIPHENHYDRAMINE HCL 25 MG PO CAPS: 25.0000 mg | ORAL_CAPSULE | ORAL | Status: DC | PRN

## 2014-11-28 MED ORDER — ACETAMINOPHEN 500 MG PO TABS
1000.0000 mg | ORAL_TABLET | Freq: Four times a day (QID) | ORAL | Status: DC
  Administered 2014-11-28: 1000 mg via ORAL
  Filled 2014-11-28: qty 2

## 2014-11-28 MED ORDER — PNEUMOCOCCAL VAC POLYVALENT 25 MCG/0.5ML IJ INJ
0.5000 mL | INJECTION | INTRAMUSCULAR | Status: DC
  Filled 2014-11-28: qty 0.5

## 2014-11-28 MED ORDER — ONDANSETRON HCL 4 MG/2ML IJ SOLN
4.0000 mg | Freq: Three times a day (TID) | INTRAMUSCULAR | Status: DC | PRN
  Filled 2014-11-28: qty 2

## 2014-11-28 MED ORDER — DIPHENHYDRAMINE HCL 25 MG PO CAPS: 25.0000 mg | ORAL_CAPSULE | Freq: Four times a day (QID) | ORAL | Status: DC | PRN

## 2014-11-28 MED ORDER — OXYCODONE-ACETAMINOPHEN 5-325 MG PO TABS: 2.0000 | ORAL_TABLET | ORAL | Status: DC | PRN

## 2014-11-28 MED ORDER — SODIUM CHLORIDE 0.9 % IJ SOLN: 3.0000 mL | INTRAMUSCULAR | Status: DC | PRN

## 2014-11-28 MED ORDER — MENTHOL 3 MG MT LOZG: 1.0000 | LOZENGE | OROMUCOSAL | Status: DC | PRN

## 2014-11-28 MED ORDER — TETANUS-DIPHTH-ACELL PERTUSSIS 5-2.5-18.5 LF-MCG/0.5 IM SUSP: 0.5000 mL | Freq: Once | INTRAMUSCULAR | Status: DC

## 2014-11-28 MED ORDER — DIBUCAINE 1 % RE OINT: 1.0000 "application " | TOPICAL_OINTMENT | RECTAL | Status: DC | PRN

## 2014-11-28 MED ORDER — SENNOSIDES-DOCUSATE SODIUM 8.6-50 MG PO TABS
2.0000 | ORAL_TABLET | ORAL | Status: DC
  Administered 2014-11-28 (×2): 2 via ORAL
  Filled 2014-11-28 (×2): qty 2

## 2014-11-28 MED ORDER — ONDANSETRON HCL 4 MG/2ML IJ SOLN
4.0000 mg | Freq: Once | INTRAMUSCULAR | Status: AC
  Administered 2014-11-28: 4 mg via INTRAVENOUS

## 2014-11-28 MED ORDER — NALOXONE HCL 1 MG/ML IJ SOLN
1.0000 ug/kg/h | INTRAVENOUS | Status: DC | PRN
  Filled 2014-11-28: qty 2

## 2014-11-28 MED ORDER — SCOPOLAMINE 1 MG/3DAYS TD PT72
1.0000 | MEDICATED_PATCH | Freq: Once | TRANSDERMAL | Status: DC
  Filled 2014-11-28: qty 1

## 2014-11-28 MED ORDER — ZOLPIDEM TARTRATE 5 MG PO TABS: 5.0000 mg | ORAL_TABLET | Freq: Every evening | ORAL | Status: DC | PRN

## 2014-11-28 MED ORDER — LANOLIN HYDROUS EX OINT: 1.0000 "application " | TOPICAL_OINTMENT | CUTANEOUS | Status: DC | PRN

## 2014-11-28 MED ORDER — DIPHENHYDRAMINE HCL 50 MG/ML IJ SOLN
12.5000 mg | INTRAMUSCULAR | Status: DC | PRN
  Administered 2014-11-28: 12.5 mg via INTRAVENOUS
  Filled 2014-11-28: qty 1

## 2014-11-28 MED ORDER — PRENATAL MULTIVITAMIN CH
1.0000 | ORAL_TABLET | Freq: Every day | ORAL | Status: DC
  Administered 2014-11-28 – 2014-11-29 (×2): 1 via ORAL
  Filled 2014-11-28 (×2): qty 1

## 2014-11-28 MED ORDER — SIMETHICONE 80 MG PO CHEW
80.0000 mg | CHEWABLE_TABLET | ORAL | Status: DC
  Administered 2014-11-28 (×2): 80 mg via ORAL
  Filled 2014-11-28 (×2): qty 1

## 2014-11-28 MED ORDER — OXYCODONE-ACETAMINOPHEN 5-325 MG PO TABS
1.0000 | ORAL_TABLET | ORAL | Status: DC | PRN
  Administered 2014-11-28: 1 via ORAL
  Filled 2014-11-28: qty 1

## 2014-11-28 MED ORDER — LACTATED RINGERS IV SOLN
INTRAVENOUS | Status: DC
  Administered 2014-11-28: 03:00:00 via INTRAVENOUS

## 2014-11-28 MED ORDER — OXYTOCIN 40 UNITS IN LACTATED RINGERS INFUSION - SIMPLE MED: 62.5000 mL/h | INTRAVENOUS | Status: DC

## 2014-11-28 MED ORDER — ACETAMINOPHEN 325 MG PO TABS: 650.0000 mg | ORAL_TABLET | ORAL | Status: DC | PRN

## 2014-11-28 NOTE — Lactation Note (Signed)
This note was copied from the chart of Margaret Lucero. Lactation Consultation Note  Patient Name: Margaret Lucero Today's Date: 11/28/2014 Reason for consult: Initial assessment   Initial consult at 15 hours old;GA-37.4; BW 7 lbs, 2.5 oz.  P2 mom with experience breastfeeding previous child.   Infant has breastfed x7(10-15 min) + attempts x2 (8 min); voids-6; stools-7 since birth 15 hours ago.  LS-7 by RN Mom states infant is breastfeeding well.  Reports unlatching and relatching when latch is painful and "needs to be adjusted". Mom had just fed infant for 18 minutes prior to [facility] visit. Lactation brochure given and informed of hospital support group and outpatient services.  Encouraged to call for assistance as needed.     Maternal Data Formula Feeding for Exclusion: No Has patient been taught Hand Expression?: Yes Does the patient have breastfeeding experience prior to this delivery?: Yes  Feeding Feeding Type: Breast Fed Length of feed: 18 min   Consult Status Consult Status: Follow-up Date: 11/29/14 Follow-up type: In-patient    Lendon Ka 11/28/2014, 12:29 PM

## 2014-11-28 NOTE — Progress Notes (Addendum)
POD # 1  Subjective: Pt reports feeling well/ Pain controlled with long acting spinal narcotic and Motrin Tolerating po/ Foley in place/ No n/v/ Flatus present Activity: up with assistance x1 Bleeding is light Newborn info:  Information for the patient's newborn:  Margaret Lucero, Nauman [[id number]]  female  Feeding: breast   Objective:  VS:  Filed Vitals:   11/28/14 0104 11/28/14 0237 11/28/14 0610 11/28/14 0934  BP: 92/39  110/62 104/44  Pulse: 84  74 73  Temp: 98.2 F (36.8 C)  98.1 F (36.7 C) 97.8 F (36.6 C)  TempSrc: Axillary  Axillary Axillary  Resp: Height:      Weight:      SpO2: 98% 100% 99% 100%     I&O: Intake/Output      07/25 0701 - 07/26 0700 07/26 0701 - 07/27 0700   I.V. (mL/kg) 2600 (32.8)    Total Intake(mL/kg) 2600 (32.8)    Urine (mL/kg/hr) 775    Emesis/NG output 0    Blood 650    Total Output 1425     Net +1175          Emesis Occurrence 1 x       Recent Labs  11/27/14 1918 11/28/14 0550  WBC 12.0* 10.4  HGB 14.4 12.7  HCT 41.6 37.5  PLT 187 168    Blood type: --/--/O POS, O POS (07/25 1918) Rubella:   Immune   Physical Exam:  General: alert, cooperative and no distress CV: Regular rate and rhythm Resp: CTA bilaterally Abdomen: soft, nontender, normal bowel sounds Incision: Covered with pressure dsg-c/d/i Uterine Fundus: firm, below umbilicus, nontender Lochia: minimal Ext: extremities normal, atraumatic, no cyanosis or edema and Homans sign is negative, no sign of DVT  GU: foley to SD, clear, amber  Assessment: POD # 1/ G5P2022/ S/P C/Section d/t repeat, labor Doing well  Plan: D/c foley Ambulate Continue routine post op orders   Signed: Donette Larry, N, MSN, CNM 11/28/2014, 10:56 AM

## 2014-11-29 ENCOUNTER — Encounter (HOSPITAL_COMMUNITY): Payer: Self-pay | Admitting: Obstetrics & Gynecology

## 2014-11-29 LAB — BIRTH TISSUE RECOVERY COLLECTION (PLACENTA DONATION)

## 2014-11-29 MED ORDER — OXYCODONE-ACETAMINOPHEN 5-325 MG PO TABS: 1.0000 | ORAL_TABLET | ORAL | Status: DC | PRN

## 2014-11-29 MED ORDER — IBUPROFEN 600 MG PO TABS: 600.0000 mg | ORAL_TABLET | Freq: Four times a day (QID) | ORAL | Status: AC

## 2014-11-29 NOTE — Progress Notes (Signed)
Patient ID: Margaret Lucero, female   DOB: [birth year], 29 y.o.   MRN: [id number] Subjective: S/P Repeat Cesarean Delivery POD# 2 Information for the patient's newborn:  Arwa, Yero Girl Aleeah [[id number]]  female  Reports feeling well Feeding: breast Patient reports tolerating PO.  Breast symptoms: none Pain controlled with ibuprofen (OTC) and narcotic analgesics including Percocet Denies HA/SOB/C/P/N/V/dizziness. Flatus present. No BM. She reports vaginal bleeding as normal, without clots.  She is ambulating, urinating without difficult.     Objective:   VS:  Filed Vitals:   11/28/14 1345 11/28/14 1745 11/28/14 2038 11/29/14 0526  BP: 104/43 103/50 99/46 98/42   Pulse: 76 75 79 62  Temp: 97.9 F (36.6 C) 98 F (36.7 C) 98.3 F (36.8 C) 98.3 F (36.8 C)  TempSrc: Oral Oral Oral Oral  Resp: Height:      Weight:      SpO2: 100% 100% 98% 98%     Intake/Output Summary (Last 24 hours) at 11/29/14 1018 Last data filed at 11/28/14 1745  Gross per 24 hour  Intake   1320 ml  Output   1450 ml  Net   -130 ml        Recent Labs  11/27/14 1918 11/28/14 0550  WBC 12.0* 10.4  HGB 14.4 12.7  HCT 41.6 37.5  PLT 187 168     Blood type: O POS (07/25 1918)  Rubella: Immune     Physical Exam:  General: alert, cooperative and no distress Abdomen: soft, nontender, normal bowel sounds Incision: Pressure Dressing halfway covering Tegaderm and Honeycomb C/D/I Uterine Fundus: firm, 2 FB below umbilicus, nontender Lochia: minimal Ext: extremities normal, atraumatic, no cyanosis or edema and Homans sign is negative, no sign of DVT   Assessment/Plan: 29 y.o.   POD# 2.  s/p Cesarean Delivery.  Indications: Repeat                Principal Problem:   Postpartum care following cesarean delivery (7/25) Active Problems:   Cesarean delivery delivered  Doing well, stable.               Regular diet as tolerated Ambulate Routine post-op care Early discharge home  today  Kenard Gower, MSN, CNM 11/29/2014, 10:00 AM

## 2014-11-29 NOTE — Discharge Instructions (Signed)
Breast Pumping Tips °If you are breastfeeding, there may be times when you cannot feed your baby directly. Returning to work or going on a trip are common examples. Pumping allows you to store breast milk and feed it to your baby later.  °You may not get much milk when you first start to pump. Your breasts should start to make more after a few days. If you pump at the times you usually feed your baby, you may be able to keep making enough milk to feed your baby without also using formula. The more often you pump, the more milk you will produce.  °WHEN SHOULD I PUMP?  °· You can begin to pump soon after delivery. However, some experts recommend waiting about 4 weeks before giving your infant a bottle to make sure breastfeeding is going well.  °· If you plan to return to work, begin pumping a few weeks before. This will help you develop techniques that work best for you. It also lets you build up a supply of breast milk.   °· When you are with your infant, feed on demand and pump after each feeding.   °· When you are away from your infant for several hours, pump for about 15 minutes every 2-3 hours. Pump both breasts at the same time if you can.   °· If your infant has a formula feeding, make sure to pump around the same time.     °· If you drink any alcohol, wait 2 hours before pumping.   °HOW DO I PREPARE TO PUMP? °Your let-down reflex is the natural reaction to stimulation that makes your breast milk flow. It is easier to stimulate this reflex when you are relaxed. Find relaxation techniques that work for you. If you have difficulty with your let-down reflex, try these methods:  °· Smell one of your infant's blankets or an item of clothing.   °· Look at a picture or video of your infant.   °· Sit in a quiet, private space.   °· Massage the breast you plan to pump.   °· Place soothing warmth on the breast.   °· Play relaxing music.   °WHAT ARE SOME GENERAL BREAST PUMPING TIPS? °· Wash your hands before you pump. You  do not need to wash your nipples or breasts. °· There are three ways to pump. °¨ You can use your hand to massage and compress your breast. °¨ You can use a handheld manual pump. °¨ You can use an electric pump.   °· Make sure the suction cup (flange) on the breast pump is the right size. Place the flange directly over the nipple. If it is the wrong size or placed the wrong way, it may be painful and cause nipple damage.   °· If pumping is uncomfortable, apply a small amount of purified or modified lanolin to your nipple and areola. °· If you are using an electric pump, adjust the speed and suction power to be more comfortable. °· If pumping is painful or if you are not getting very much milk, you may need a different type of pump. A lactation consultant can help you determine what type of pump to use.   °· Keep a full water bottle near you at all times. Drinking lots of fluid helps you make more milk.  °· You can store your milk to use later. Pumped breast milk can be stored in a sealable, sterile container or plastic bag. Label all stored breast milk with the date you pumped it. °¨ Milk can stay out at room temperature for up to 8 hours. °¨   You can store your milk in the refrigerator for up to 8 days. °¨ You can store your milk in the freezer for 3 months. Thaw frozen milk using warm water. Do not put it in the microwave. °· Do not smoke. Smoking can lower your milk supply and harm your infant. If you need help quitting, ask your health care provider to recommend a program.   °WHEN SHOULD I CALL MY HEALTH CARE PROVIDER OR A LACTATION CONSULTANT? °· You are having trouble pumping. °· You are concerned that you are not making enough milk. °· You have nipple pain, soreness, or redness. °· You want to use birth control. Birth control pills may lower your milk supply. Talk to your health care provider about your options. °Document Released: 10/09/2009 Document Revised: 04/26/2013 Document Reviewed:  02/11/2013 °Exit[employer]® Patient Information ©2015 [employer]. This information is not intended to replace advice given to you by your health care provider. Make sure you discuss any questions you have with your health care provider. ° °Nutrition for the New Mother  °A new mother needs good health and nutrition so she can have energy to take care of a new baby. Whether a mother breastfeeds or formula feeds the baby, it is important to have a well-balanced diet. Foods from all the food groups should be chosen to meet the new mother's energy needs and to give her the nutrients needed for repair and healing.  °A HEALTHY EATING PLAN °The My [employer] plan for Moms outlines what you should eat to help you and your baby stay healthy. The energy and amount of food you need depends on whether or not you are breastfeeding. If you are breastfeeding you will need more nutrients. If you choose not to breastfeed, your nutrition goal should be to return to a healthy weight. Limiting calories may be needed if you are not breastfeeding.  °HOME CARE INSTRUCTIONS  °· For a personal plan based on your unique needs, see your Registered Dietitian or visit www.mypyramid.gov. °· Eat a variety of foods. The plan below will help guide you. The following chart has a suggested daily meal plan from the [employer] for Moms. °· Eat a variety of fruits and vegetables. °· Eat more dark green and orange vegetables and cooked dried beans. °· Make half your grains whole grains. Choose whole instead of refined grains. °· Choose low-fat or lean meats and poultry. °· Choose low-fat or fat-free dairy products like milk, cheese, or yogurt. °Fruits °· Breastfeeding: 2 cups °· Non-Breastfeeding: 2 cups °· What Counts as a serving? °¨ 1 cup of fruit or juice. °¨ ½ cup dried fruit. °Vegetables °· Breastfeeding: 3 cups °· Non-Breastfeeding: 2 ½ cups °· What Counts as a serving? °¨ 1 cup raw or cooked vegetables. °¨ Juice or 2 cups raw leafy  vegetables. °Grains °· Breastfeeding: 8 oz °· Non-Breastfeeding: 6 oz °· What Counts as a serving? °¨ 1 slice bread. °¨ 1 oz ready-to-eat cereal. °¨ ½ cup cooked pasta, rice, or cereal. °Meat and Beans °· Breastfeeding: 6 ½ oz °· Non-Breastfeeding: 5 ½ oz °· What Counts as a serving? °¨ 1 oz lean meat, poultry, or fish °¨ ¼ cup cooked dry beans °¨ ½ oz nuts or 1 egg °¨ 1 tbs peanut butter °Milk °· Breastfeeding: 3 cups °· Non-Breastfeeding: 3 cups °· What Counts as a serving? °¨ 1 cup milk. °¨ 8 oz yogurt. °¨ 1 ½ oz cheese. °¨ 2 oz processed cheese. °TIPS FOR THE BREASTFEEDING MOM °· Rapid weight   loss is not suggested when you are breastfeeding. By simply breastfeeding, you will be able to lose the weight gained during your pregnancy. Your caregiver can keep track of your weight and tell you if your weight loss is appropriate. °· Be sure to drink fluids. You may notice that you are thirstier than usual. A suggestion is to drink a glass of water or other beverage whenever you breastfeed. °· Avoid alcohol as it can be passed into your breast milk. °· Limit caffeine drinks to no more than 2 to 3 cups per day. °· You may need to keep taking your prenatal vitamin while you are breastfeeding. Talk with your caregiver about taking a vitamin or supplement. °RETURING TO A HEALTHY WEIGHT °· The My [employer] Plan for Moms will help you return to a healthy weight. It will also provide the nutrients you need. °· You may need to limit "empty" calories. These include: °¨ High fat foods like fried foods, fatty meats, fast food, butter, and mayonnaise. °¨ High sugar foods like sodas, jelly, candy, and sweets. °· Be physically active. Include 30 minutes of exercise or more each day. Choose an activity you like such as walking, swimming, biking, or aerobics. Check with your caregiver before you start to exercise. °Document Released: 07/29/2007 Document Revised: 07/14/2011 Document Reviewed: 07/29/2007 °ExitCare® Patient Information  ©2015 [employer]. This information is not intended to replace advice given to you by your health care provider. Make sure you discuss any questions you have with your health care provider. °Postpartum Depression and Baby Blues °The postpartum period begins right after the birth of a baby. During this time, there is often a great amount of joy and excitement. It is also a time of many changes in the life of the parents. Regardless of how many times a mother gives birth, each child brings new challenges and dynamics to the family. It is not unusual to have feelings of excitement along with confusing shifts in moods, emotions, and thoughts. All mothers are at risk of developing postpartum depression or the "baby blues." These mood changes can occur right after giving birth, or they may occur many months after giving birth. The baby blues or postpartum depression can be mild or severe. Additionally, postpartum depression can go away rather quickly, or it can be a long-term condition.  °CAUSES °Raised hormone levels and the rapid drop in those levels are thought to be a main cause of postpartum depression and the baby blues. A number of hormones change during and after pregnancy. Estrogen and progesterone usually decrease right after the delivery of your baby. The levels of thyroid hormone and various cortisol steroids also rapidly drop. Other factors that play a role in these mood changes include major life events and genetics.  °RISK FACTORS °If you have any of the following risks for the baby blues or postpartum depression, know what symptoms to watch out for during the postpartum period. Risk factors that may increase the likelihood of getting the baby blues or postpartum depression include: °· Having a personal or family history of depression.   °· Having depression while being pregnant.   °· Having premenstrual mood issues or mood issues related to oral contraceptives. °· Having a lot of life stress.   °· Having  marital conflict.   °· Lacking a social support network.   °· Having a baby with special needs.   °· Having health problems, such as diabetes.   °SIGNS AND SYMPTOMS °Symptoms of baby blues include: °· Brief changes in mood, such as going   from extreme happiness to sadness. °· Decreased concentration.   °· Difficulty sleeping.   °· Crying spells, tearfulness.   °· Irritability.   °· Anxiety.   °Symptoms of postpartum depression typically begin within the first month after giving birth. These symptoms include: °· Difficulty sleeping or excessive sleepiness.   °· Marked weight loss.   °· Agitation.   °· Feelings of worthlessness.   °· Lack of interest in activity or food.   °Postpartum psychosis is a very serious condition and can be dangerous. Fortunately, it is rare. Displaying any of the following symptoms is cause for immediate medical attention. Symptoms of postpartum psychosis include:  °· Hallucinations and delusions.   °· Bizarre or disorganized behavior.   °· Confusion or disorientation.   °DIAGNOSIS  °A diagnosis is made by an evaluation of your symptoms. There are no medical or lab tests that lead to a diagnosis, but there are various questionnaires that a health care provider may use to identify those with the baby blues, postpartum depression, or psychosis. Often, a screening tool called the Edinburgh Postnatal Depression Scale is used to diagnose depression in the postpartum period.  °TREATMENT °The baby blues usually goes away on its own in 1-2 weeks. Social support is often all that is needed. You will be encouraged to get adequate sleep and rest. Occasionally, you may be given medicines to help you sleep.  °Postpartum depression requires treatment because it can last several months or longer if it is not treated. Treatment may include individual or group therapy, medicine, or both to address any social, physiological, and psychological factors that may play a role in the depression. Regular exercise, a  healthy diet, rest, and social support may also be strongly recommended.  °Postpartum psychosis is more serious and needs treatment right away. Hospitalization is often needed. °HOME CARE INSTRUCTIONS °· Get as much rest as you can. Nap when the baby sleeps.   °· Exercise regularly. Some women find yoga and walking to be beneficial.   °· Eat a balanced and nourishing diet.   °· Do little things that you enjoy. Have a cup of tea, take a bubble bath, read your favorite magazine, or listen to your favorite music. °· Avoid alcohol.   °· Ask for help with household chores, cooking, grocery shopping, or running errands as needed. Do not try to do everything.   °· Talk to people close to you about how you are feeling. Get support from your partner, family members, friends, or other new moms. °· Try to stay positive in how you think. Think about the things you are grateful for.   °· Do not spend a lot of time alone.   °· Only take over-the-counter or prescription medicine as directed by your health care provider. °· Keep all your postpartum appointments.   °· Let your health care provider know if you have any concerns.   °SEEK MEDICAL CARE IF: °You are having a reaction to or problems with your medicine. °SEEK IMMEDIATE MEDICAL CARE IF: °· You have suicidal feelings.   °· You think you may harm the baby or someone else. °MAKE SURE YOU: °· Understand these instructions. °· Will watch your condition. °· Will get help right away if you are not doing well or get worse. °Document Released: 01/24/2004 Document Revised: 04/26/2013 Document Reviewed: 01/31/2013 °Exit[employer]® Patient Information ©2015 [employer]. This information is not intended to replace advice given to you by your health care provider. Make sure you discuss any questions you have with your health care provider. °Postpartum Care After Cesarean Delivery °After you deliver your newborn (postpartum period), the usual stay in   the hospital is 24-72 hours. If there were  problems with your labor or delivery, or if you have other medical problems, you might be in the hospital longer.  °While you are in the hospital, you will receive help and instructions on how to care for yourself and your newborn during the postpartum period.  °While you are in the hospital: °· It is normal for you to have pain or discomfort from the incision in your abdomen. Be sure to tell your nurses when you are having pain, where the pain is located, and what makes the pain worse. °· If you are breastfeeding, you may feel uncomfortable contractions of your uterus for a couple of weeks. This is normal. The contractions help your uterus get back to normal size. °· It is normal to have some bleeding after delivery. °· For the first 1-3 days after delivery, the flow is red and the amount may be similar to a period. °· It is common for the flow to start and stop. °· In the first few days, you may pass some small clots. Let your nurses know if you begin to pass large clots or your flow increases. °· Do not  flush blood clots down the toilet before having the nurse look at them. °· During the next 3-10 days after delivery, your flow should become more watery and pink or brown-tinged in color. °· Ten to fourteen days after delivery, your flow should be a small amount of yellowish-white discharge. °· The amount of your flow will decrease over the first few weeks after delivery. Your flow may stop in 6-8 weeks. Most women have had their flow stop by 12 weeks after delivery. °· You should change your sanitary pads frequently. °· Wash your hands thoroughly with soap and water for at least 20 seconds after changing pads, using the toilet, or before holding or feeding your newborn. °· Your intravenous (IV) tubing will be removed when you are drinking enough fluids. °· The urine drainage tube (urinary catheter) that was inserted before delivery may be removed within 6-8 hours after delivery or when feeling returns to your  legs. You should feel like you need to empty your bladder within the first 6-8 hours after the catheter has been removed. °· In case you become weak, lightheaded, or faint, call your nurse before you get out of bed for the first time and before you take a shower for the first time. °· Within the first few days after delivery, your breasts may begin to feel tender and full. This is called engorgement. Breast tenderness usually goes away within 48-72 hours after engorgement occurs. You may also notice milk leaking from your breasts. If you are not breastfeeding, do not stimulate your breasts. Breast stimulation can make your breasts produce more milk. °· Spending as much time as possible with your newborn is very important. During this time, you and your newborn can feel close and get to know each other. Having your newborn stay in your room (rooming in) will help to strengthen the bond with your newborn. It will give you time to get to know your newborn and become comfortable caring for your newborn. °· Your hormones change after delivery. Sometimes the hormone changes can temporarily cause you to feel sad or tearful. These feelings should not last more than a few days. If these feelings last longer than that, you should talk to your caregiver. °· If desired, talk to your caregiver about methods of family planning or contraception. °·   Talk to your caregiver about immunizations. Your caregiver may want you to have the following immunizations before leaving the hospital: °· Tetanus, diphtheria, and pertussis (Tdap) or tetanus and diphtheria (Td) immunization. It is very important that you and your family (including grandparents) or others caring for your newborn are up-to-date with the Tdap or Td immunizations. The Tdap or Td immunization can help protect your newborn from getting ill. °· Rubella immunization. °· Varicella (chickenpox) immunization. °· Influenza immunization. You should receive this annual immunization  if you did not receive the immunization during your pregnancy. °Document Released: 01/14/2012 Document Reviewed: 01/14/2012 °[employer]® Patient Information ©2015 [employer]. This information is not intended to replace advice given to you by your health care provider. Make sure you discuss any questions you have with your health care provider. °Breastfeeding and Mastitis °Mastitis is inflammation of the breast tissue. It can occur in women who are breastfeeding. This can make breastfeeding painful. Mastitis will sometimes go away on its own. Your health care provider will help determine if treatment is needed. °CAUSES °Mastitis is often associated with a blocked milk (lactiferous) duct. This can happen when too much milk builds up in the breast. Causes of excess milk in the breast can include: °· Poor latch-on. If your baby is not latched onto the breast properly, she or he may not empty your breast completely while breastfeeding. °· Allowing too much time to pass between feedings. °· Wearing a bra or other clothing that is too tight. This puts extra pressure on the lactiferous ducts so milk does not flow through them as it should. °Mastitis can also be caused by a bacterial infection. Bacteria may enter the breast tissue through cuts or openings in the skin. In women who are breastfeeding, this may occur because of cracked or irritated skin. Cracks in the skin are often caused when your baby does not latch on properly to the breast. °SIGNS AND SYMPTOMS °· Swelling, redness, tenderness, and pain in an area of the breast. °· Swelling of the glands under the arm on the same side. °· Fever may or may not accompany mastitis. °If an infection is allowed to progress, a collection of pus (abscess) may develop. °DIAGNOSIS  °Your health care provider can usually diagnose mastitis based on your symptoms and a physical exam. Tests may be done to help confirm the diagnosis. These may include: °· Removal of pus from the breast  by applying pressure to the area. This pus can be examined in the lab to determine which bacteria are present. If an abscess has developed, the fluid in the abscess can be removed with a needle. This can also be used to confirm the diagnosis and determine the bacteria present. In most cases, pus will not be present. °· Blood tests to determine if your body is fighting a bacterial infection. °· Mammogram or ultrasound tests to rule out other problems or diseases. °TREATMENT  °Mastitis that occurs with breastfeeding will sometimes go away on its own. Your health care provider may choose to wait 24 hours after first seeing you to decide whether a prescription medicine is needed. If your symptoms are worse after 24 hours, your health care provider will likely prescribe an antibiotic medicine to treat the mastitis. He or she will determine which bacteria are most likely causing the infection and will then select an appropriate antibiotic medicine. This is sometimes changed based on the results of tests performed to identify the bacteria, or if there is no response to the   antibiotic medicine selected. Antibiotic medicines are usually given by mouth. You may also be given medicine for pain. °HOME CARE INSTRUCTIONS °· Only take over-the-counter or prescription medicines for pain, fever, or discomfort as directed by your health care provider. °· If your health care provider prescribed an antibiotic medicine, take the medicine as directed. Make sure you finish it even if you start to feel better. °· Do not wear a tight or underwire bra. Wear a soft, supportive bra. °· Increase your fluid intake, especially if you have a fever. °· Continue to empty the breast. Your health care provider can tell you whether this milk is safe for your infant or needs to be thrown out. You may be told to stop nursing until your health care provider thinks it is safe for your baby. Use a breast pump if you are advised to stop nursing. °· Keep your  nipples clean and dry. °· Empty the first breast completely before going to the other breast. If your baby is not emptying your breasts completely for some reason, use a breast pump to empty your breasts. °· If you go back to work, pump your breasts while at work to stay in time with your nursing schedule. °· Avoid allowing your breasts to become overly filled with milk (engorged). °SEEK MEDICAL CARE IF: °· You have pus-like discharge from the breast. °· Your symptoms do not improve with the treatment prescribed by your health care provider within 2 days. °SEEK IMMEDIATE MEDICAL CARE IF: °· Your pain and swelling are getting worse. °· You have pain that is not controlled with medicine. °· You have a red line extending from the breast toward your armpit. °· You have a fever or persistent symptoms for more than 2-3 days. °· You have a fever and your symptoms suddenly get worse. °MAKE SURE YOU:  °· Understand these instructions. °· Will watch your condition. °· Will get help right away if you are not doing well or get worse. °Document Released: 08/16/2004 Document Revised: 04/26/2013 Document Reviewed: 11/25/2012 °[employer]® Patient Information ©2015 [employer]. This information is not intended to replace advice given to you by your health care provider. Make sure you discuss any questions you have with your health care provider. °Breastfeeding °Deciding to breastfeed is one of the best choices you can make for you and your baby. A change in hormones during pregnancy causes your breast tissue to grow and increases the number and size of your milk ducts. These hormones also allow proteins, sugars, and fats from your blood supply to make breast milk in your milk-producing glands. Hormones prevent breast milk from being released before your baby is born as well as prompt milk flow after birth. Once breastfeeding has begun, thoughts of your baby, as well as his or her sucking or crying, can stimulate the release of milk  from your milk-producing glands.  °BENEFITS OF BREASTFEEDING °For Your Baby °· Your first milk (colostrum) helps your baby's digestive system function better.   °· There are antibodies in your milk that help your baby fight off infections.   °· Your baby has a lower incidence of asthma, allergies, and sudden infant death syndrome.   °· The nutrients in breast milk are better for your baby than infant formulas and are designed uniquely for your baby's needs.   °· Breast milk improves your baby's brain development.   °· Your baby is less likely to develop other conditions, such as childhood obesity, asthma, or type 2 diabetes mellitus.   °For You  °·   Breastfeeding helps to create a very special bond between you and your baby.   °· Breastfeeding is convenient. Breast milk is always available at the correct temperature and costs nothing.   °· Breastfeeding helps to burn calories and helps you lose the weight gained during pregnancy.   °· Breastfeeding makes your uterus contract to its prepregnancy size faster and slows bleeding (lochia) after you give birth.   °· Breastfeeding helps to lower your risk of developing type 2 diabetes mellitus, osteoporosis, and breast or ovarian cancer later in life. °SIGNS THAT YOUR BABY IS HUNGRY °Early Signs of Hunger  °· Increased alertness or activity. °· Stretching. °· Movement of the head from side to side. °· Movement of the head and opening of the mouth when the corner of the mouth or cheek is stroked (rooting). °· Increased sucking sounds, smacking lips, cooing, sighing, or squeaking. °· Hand-to-mouth movements. °· Increased sucking of fingers or hands. °Late Signs of Hunger °· Fussing. °· Intermittent crying. °Extreme Signs of Hunger °Signs of extreme hunger will require calming and consoling before your baby will be able to breastfeed successfully. Do not wait for the following signs of extreme hunger to occur before you initiate breastfeeding:   °· Restlessness. °· A loud,  strong cry. °·  Screaming. °BREASTFEEDING BASICS °Breastfeeding Initiation °· Find a comfortable place to sit or lie down, with your neck and back well supported. °· Place a pillow or rolled up blanket under your baby to bring him or her to the level of your breast (if you are seated). Nursing pillows are specially designed to help support your arms and your baby while you breastfeed. °· Make sure that your baby's abdomen is facing your abdomen.   °· Gently massage your breast. With your fingertips, massage from your chest wall toward your nipple in a circular motion. This encourages milk flow. You may need to continue this action during the feeding if your milk flows slowly. °· Support your breast with 4 fingers underneath and your thumb above your nipple. Make sure your fingers are well away from your nipple and your baby's mouth.   °· Stroke your baby's lips gently with your finger or nipple.   °· When your baby's mouth is open wide enough, quickly bring your baby to your breast, placing your entire nipple and as much of the colored area around your nipple (areola) as possible into your baby's mouth.   °¨ More areola should be visible above your baby's upper lip than below the lower lip.   °¨ Your baby's tongue should be between his or her lower gum and your breast.   °· Ensure that your baby's mouth is correctly positioned around your nipple (latched). Your baby's lips should create a seal on your breast and be turned out (everted). °· It is common for your baby to suck about 2-3 minutes in order to start the flow of breast milk. °Latching °Teaching your baby how to latch on to your breast properly is very important. An improper latch can cause nipple pain and decreased milk supply for you and poor weight gain in your baby. Also, if your baby is not latched onto your nipple properly, he or she may swallow some air during feeding. This can make your baby fussy. Burping your baby when you switch breasts during the  feeding can help to get rid of the air. However, teaching your baby to latch on properly is still the best way to prevent fussiness from swallowing air while breastfeeding. °Signs that your baby has successfully latched on to   your nipple:    °· Silent tugging or silent sucking, without causing you pain.   °· Swallowing heard between every 3-4 sucks.   °·  Muscle movement above and in front of his or her ears while sucking.   °Signs that your baby has not successfully latched on to nipple:  °· Sucking sounds or smacking sounds from your baby while breastfeeding. °· Nipple pain. °If you think your baby has not latched on correctly, slip your finger into the corner of your baby's mouth to break the suction and place it between your baby's gums. Attempt breastfeeding initiation again. °Signs of Successful Breastfeeding °Signs from your baby:   °· A gradual decrease in the number of sucks or complete cessation of sucking.   °· Falling asleep.   °· Relaxation of his or her body.   °· Retention of a small amount of milk in his or her mouth.   °· Letting go of your breast by himself or herself. °Signs from you: °· Breasts that have increased in firmness, weight, and size 1-3 hours after feeding.   °· Breasts that are softer immediately after breastfeeding. °· Increased milk volume, as well as a change in milk consistency and color by the fifth day of breastfeeding.   °· Nipples that are not sore, cracked, or bleeding. °Signs That Your Baby is Getting Enough Milk °· Wetting at least 3 diapers in a 24-hour period. The urine should be clear and pale yellow by age 5 days. °· At least 3 stools in a 24-hour period by age 5 days. The stool should be soft and yellow. °· At least 3 stools in a 24-hour period by age 7 days. The stool should be seedy and yellow. °· No loss of weight greater than 10% of birth weight during the first 3 days of age. °· Average weight gain of 4-7 ounces (113-198 g) per week after age 4 days. °· Consistent  daily weight gain by age 5 days, without weight loss after the age of 2 weeks. °After a feeding, your baby may spit up a small amount. This is common. °BREASTFEEDING FREQUENCY AND DURATION °Frequent feeding will help you make more milk and can prevent sore nipples and breast engorgement. Breastfeed when you feel the need to reduce the fullness of your breasts or when your baby shows signs of hunger. This is called "breastfeeding on demand." Avoid introducing a pacifier to your baby while you are working to establish breastfeeding (the first 4-6 weeks after your baby is born). After this time you may choose to use a pacifier. Research has shown that pacifier use during the first year of a baby's life decreases the risk of sudden infant death syndrome (SIDS). °Allow your baby to feed on each breast as long as he or she wants. Breastfeed until your baby is finished feeding. When your baby unlatches or falls asleep while feeding from the first breast, offer the second breast. Because newborns are often sleepy in the first few weeks of life, you may need to awaken your baby to get him or her to feed. °Breastfeeding times will vary from baby to baby. However, the following rules can serve as a guide to help you ensure that your baby is properly fed: °· Newborns (babies 4 weeks of age or younger) may breastfeed every 1-3 hours. °· Newborns should not go longer than 3 hours during the day or 5 hours during the night without breastfeeding. °· You should breastfeed your baby a minimum of 8 times in a 24-hour period until you begin to   introduce solid foods to your baby at around 6 months of age. °BREAST MILK PUMPING °Pumping and storing breast milk allows you to ensure that your baby is exclusively fed your breast milk, even at times when you are unable to breastfeed. This is especially important if you are going back to work while you are still breastfeeding or when you are not able to be present during feedings. Your  lactation consultant can give you guidelines on how long it is safe to store breast milk.  °A breast pump is a machine that allows you to pump milk from your breast into a sterile bottle. The pumped breast milk can then be stored in a refrigerator or freezer. Some breast pumps are operated by hand, while others use electricity. Ask your lactation consultant which type will work best for you. Breast pumps can be purchased, but some hospitals and breastfeeding support groups lease breast pumps on a monthly basis. A lactation consultant can teach you how to hand express breast milk, if you prefer not to use a pump.  °CARING FOR YOUR BREASTS WHILE YOU BREASTFEED °Nipples can become dry, cracked, and sore while breastfeeding. The following recommendations can help keep your breasts moisturized and healthy: °· Avoid using soap on your nipples.   °· Wear a supportive bra. Although not required, special nursing bras and tank tops are designed to allow access to your breasts for breastfeeding without taking off your entire bra or top. Avoid wearing underwire-style bras or extremely tight bras. °· Air dry your nipples for 3-4 minutes after each feeding.   °· Use only cotton bra pads to absorb leaked breast milk. Leaking of breast milk between feedings is normal.   °· Use lanolin on your nipples after breastfeeding. Lanolin helps to maintain your skin's normal moisture barrier. If you use pure lanolin, you do not need to wash it off before feeding your baby again. Pure lanolin is not toxic to your baby. You may also hand express a few drops of breast milk and gently massage that milk into your nipples and allow the milk to air dry. °In the first few weeks after giving birth, some women experience extremely full breasts (engorgement). Engorgement can make your breasts feel heavy, warm, and tender to the touch. Engorgement peaks within 3-5 days after you give birth. The following recommendations can help ease  engorgement: °· Completely empty your breasts while breastfeeding or pumping. You may want to start by applying warm, moist heat (in the shower or with warm water-soaked hand towels) just before feeding or pumping. This increases circulation and helps the milk flow. If your baby does not completely empty your breasts while breastfeeding, pump any extra milk after he or she is finished. °· Wear a snug bra (nursing or regular) or tank top for 1-2 days to signal your body to slightly decrease milk production. °· Apply ice packs to your breasts, unless this is too uncomfortable for you. °· Make sure that your baby is latched on and positioned properly while breastfeeding. °If engorgement persists after 48 hours of following these recommendations, contact your health care provider or a lactation consultant. °OVERALL HEALTH CARE RECOMMENDATIONS WHILE BREASTFEEDING °· Eat healthy foods. Alternate between meals and snacks, eating 3 of each per day. Because what you eat affects your breast milk, some of the foods may make your baby more irritable than usual. Avoid eating these foods if you are sure that they are negatively affecting your baby. °· Drink milk, fruit juice, and water to satisfy your   thirst (about 10 glasses a day).   °· Rest often, relax, and continue to take your prenatal vitamins to prevent fatigue, stress, and anemia. °· Continue breast self-awareness checks. °· Avoid chewing and smoking tobacco. °· Avoid alcohol and drug use. °Some medicines that may be harmful to your baby can pass through breast milk. It is important to ask your health care provider before taking any medicine, including all over-the-counter and prescription medicine as well as vitamin and herbal supplements. °It is possible to become pregnant while breastfeeding. If birth control is desired, ask your health care provider about options that will be safe for your baby. °SEEK MEDICAL CARE IF:  °· You feel like you want to stop breastfeeding  or have become frustrated with breastfeeding. °· You have painful breasts or nipples. °· Your nipples are cracked or bleeding. °· Your breasts are red, tender, or warm. °· You have a swollen area on either breast. °· You have a fever or chills. °· You have nausea or vomiting. °· You have drainage other than breast milk from your nipples. °· Your breasts do not become full before feedings by the fifth day after you give birth. °· You feel sad and depressed. °· Your baby is too sleepy to eat well. °· Your baby is having trouble sleeping.   °· Your baby is wetting less than 3 diapers in a 24-hour period. °· Your baby has less than 3 stools in a 24-hour period. °· Your baby's skin or the white part of his or her eyes becomes yellow.   °· Your baby is not gaining weight by 5 days of age. °SEEK IMMEDIATE MEDICAL CARE IF:  °· Your baby is overly tired (lethargic) and does not want to wake up and feed. °· Your baby develops an unexplained fever. °Document Released: 04/21/2005 Document Revised: 04/26/2013 Document Reviewed: 10/13/2012 °ExitCare® Patient Information ©2015 [employer]. This information is not intended to replace advice given to you by your health care provider. Make sure you discuss any questions you have with your health care provider. ° °

## 2014-11-29 NOTE — Discharge Summary (Signed)
POSTOPERATIVE DISCHARGE SUMMARY:  Patient ID: Margaret Lucero MRN: [id number] DOB/AGE: 30-Oct-1985 29 y.o.  Admit date: 11/27/2014 Admission Diagnoses: S/P Repeat Cesarean Delivery   Discharge date:  11/29/2014 Discharge Diagnoses: S/P Repeat C/S on 11/27/2014        Prenatal history: [id number]   EDC : 12/14/2014, by LMP Has received prenatal care at [facility] since 9.[redacted] wks gestation. Primary provider : Dr. Juliene Pina Prenatal course complicated by Previous Cesarean Delivery / H/O Pregnancy Loss  Prenatal Labs: ABO, Rh: O POS (07/25 1918)  Antibody: NEG (07/25 1918) Rubella: Immune  RPR: Non Reactive (07/25 1918)  HBsAg: Non Reactive HIV: Non Reactive  GTT : Normal - 135 GBS: Negative   Medical / Surgical History :  Past medical history:  Past Medical History  Diagnosis Date  . Asthma   . Preterm contractions     2 prior pregnancies    Past surgical history:  Past Surgical History  Procedure Laterality Date  . Hernia repair    . Cesarean section    . Tendon repair    . Cesarean section N/A 11/27/2014    Procedure: REPEAT CESAREAN SECTION;  Surgeon: Shea Evans, MD;  Location: [facility];  Service: Obstetrics;  Laterality: N/A;     Allergies: Shellfish allergy and Iodine   Intrapartum Course:  Admitted for repeat cesarean delivery - see operative note for further details Physical Exam:   VSS: Blood pressure 98/42, pulse 62, temperature 98.3 F (36.8 C), temperature source Oral, resp. rate 18, height 5' (1.524 m), weight 79.181 kg (174 lb 9 oz), last menstrual period 02/06/2014, SpO2 98 %, currently breastfeeding.  LABS:  Recent Labs  11/27/14 1918 11/28/14 0550  WBC 12.0* 10.4  HGB 14.4 12.7  PLT 187 168    Newborn Data Live born female  Birth Weight: 7 lb 2.5 oz (3245 g) APGAR: 8, 9  See operative report for further details  Home with mother.  Discharge Instructions:  Wound Care: keep clean and dry / remove honeycomb POD  5 Postpartum Instructions: [facility] discharge booklet - instructions reviewed Medications:    Medication List    STOP taking these medications        promethazine 25 MG tablet  Commonly known as:  PHENERGAN      TAKE these medications        acetaminophen 500 MG tablet  Commonly known as:  TYLENOL  Take 1,000 mg by mouth every 6 (six) hours as needed for mild pain or headache.     albuterol 108 (90 BASE) MCG/ACT inhaler  Commonly known as:  PROVENTIL HFA;VENTOLIN HFA  Inhale 2 puffs into the lungs every 6 (six) hours as needed for wheezing or shortness of breath.     ibuprofen 600 MG tablet  Commonly known as:  ADVIL,MOTRIN  Take 1 tablet (600 mg total) by mouth every 6 (six) hours.     oxyCODONE-acetaminophen 5-325 MG per tablet  Commonly known as:  PERCOCET/ROXICET  Take 1 tablet by mouth every 4 (four) hours as needed (for pain scale 4-7).     prenatal multivitamin Tabs tablet  Take 1 tablet by mouth.     ranitidine 75 MG tablet  Commonly known as:  ZANTAC  Take 75 mg by mouth daily as needed for heartburn.           Follow-up Information    Follow up with MODY,VAISHALI R, MD. Schedule an appointment as soon as possible for a visit in 6 weeks.   Specialty:  Obstetrics and Gynecology   Why:  postpartum visit   Contact [address] [place] [address] [phone]         Signed: Kenard Gower, MSN, CNM 11/29/2014, 10:05 AM

## 2014-12-05 ENCOUNTER — Other Ambulatory Visit (HOSPITAL_COMMUNITY): Payer: Self-pay

## 2015-03-07 ENCOUNTER — Encounter: Payer: Self-pay | Admitting: Diagnostic Neuroimaging

## 2015-03-07 ENCOUNTER — Ambulatory Visit (INDEPENDENT_AMBULATORY_CARE_PROVIDER_SITE_OTHER): Payer: BLUE CROSS/BLUE SHIELD | Admitting: Diagnostic Neuroimaging

## 2015-03-07 VITALS — BP 117/75 | HR 101 | Ht 60.0 in | Wt 160.8 lb

## 2015-03-07 DIAGNOSIS — G44209 Tension-type headache, unspecified, not intractable: Secondary | ICD-10-CM | POA: Diagnosis not present

## 2015-03-07 DIAGNOSIS — R0683 Snoring: Secondary | ICD-10-CM

## 2015-03-07 DIAGNOSIS — G43009 Migraine without aura, not intractable, without status migrainosus: Secondary | ICD-10-CM | POA: Insufficient documentation

## 2015-03-07 DIAGNOSIS — G4719 Other hypersomnia: Secondary | ICD-10-CM | POA: Diagnosis not present

## 2015-03-07 NOTE — Patient Instructions (Addendum)
Thank you for coming to see [place] at [facility]. I hope we have been able to provide you high quality care today.  You may receive a patient satisfaction survey over the next few weeks. We would appreciate your feedback and comments so that we may continue to improve ourselves and the health of our patients.  - I will check MRI brain and sleep study - Follow up with ophthalmology to look for swelling in eyes (papilledema / pseudotumor cerebri findings)   ~~~~~~~~~~~~~~~~~~~~~~~~~~~~~~~~~~~~~~~~~~~~~~~~~~~~~~~~~~~~~~~~~  DR. Arjay Jaskiewicz'S GUIDE TO HAPPY AND HEALTHY LIVING These are some of my general health and wellness recommendations. Some of them may apply to you better than others. Please use common sense as you try these suggestions and feel free to ask me any questions.   ACTIVITY/FITNESS Mental, social, emotional and physical stimulation are very important for brain and body health. Try learning a new activity (arts, music, language, sports, games).  Keep moving your body to the best of your abilities. You can do this at home, inside or outside, the park, community center, gym or anywhere you like. Consider a physical therapist or personal trainer to get started. Consider the app Sworkit. Fitness trackers such as smart-watches, smart-phones or Fitbits can help as well.   NUTRITION Eat more plants: colorful vegetables, nuts, seeds and berries.  Eat less sugar, salt, preservatives and processed foods.  Avoid toxins such as cigarettes and alcohol.  Drink water when you are thirsty. Warm water with a slice of lemon is an excellent morning drink to start the day.  Consider these websites for more information The Nutrition Source (https://www.henry-hernandez.biz/) Precision Nutrition (WindowBlog.ch)   RELAXATION Consider practicing mindfulness meditation or other relaxation techniques such as deep breathing, prayer, yoga,  tai chi, massage. See website mindful.org or the apps Headspace or Calm to help get started.   SLEEP Try to get at least 7-8+ hours sleep per day. Regular exercise and reduced caffeine will help you sleep better. Practice good sleep hygeine techniques. See website sleep.org for more information.   PLANNING Prepare estate planning, living will, healthcare POA documents. Sometimes this is best planned with the help of an attorney. Theconversationproject.org and agingwithdignity.org are excellent resources.

## 2015-03-07 NOTE — Progress Notes (Signed)
[facility]  PATIENT: Margaret Lucero DOB: July 24, 1985  REFERRING CLINICIAN: Herold Harms  HISTORY FROM: patient  REASON FOR VISIT: new consult    HISTORICAL  CHIEF COMPLAINT:  Chief Complaint  Patient presents with  . Headache    rm 7, New Patient    HISTORY OF PRESENT ILLNESS:   29 year old right-handed female here for evaluation of headaches. Around June 2016 patient was in third term of pregnancy, and developed headaches. July 25 she delivered healthy baby. Headaches continued. Now patient having increasing headaches, ringing in ears, nausea. Patient describes pressure sensation back of head, sharp pain in her temples, and top of her head. No photophobia or phonophobia. Headaches last 15 minutes up to 2 hours a time. Headaches seem to be worse early in the morning. Sometimes having nausea. Patient is still nursing and therefore has not tried a Friday medications. Currently she is using naproxen as needed.  Patient had occasional headaches in the past. In middle school she had migraine headaches with photophobia, throbbing sensation, nausea.   REVIEW OF SYSTEMS: Full 14 system review of systems performed and notable only for sleepiness snoring headache dizziness easy bruising lymph node enlargement constipation allergy skin sensitivity ringing in ears fatigue.  ALLERGIES: Allergies  Allergen Reactions  . Shellfish Allergy Hives and Shortness Of Breath  . Iodine Hives    HOME MEDICATIONS: Outpatient Prescriptions Prior to Visit  Medication Sig Dispense Refill  . albuterol (PROVENTIL HFA;VENTOLIN HFA) 108 (90 BASE) MCG/ACT inhaler Inhale 2 puffs into the lungs every 6 (six) hours as needed for wheezing or shortness of breath.    . Prenatal Vit-Fe Fumarate-FA (PRENATAL MULTIVITAMIN) TABS tablet Take 1 tablet by mouth.     Marland Kitchen acetaminophen (TYLENOL) 500 MG tablet Take 1,000 mg by mouth every 6 (six) hours as needed for mild pain or headache.     . ibuprofen  (ADVIL,MOTRIN) 600 MG tablet Take 1 tablet (600 mg total) by mouth every 6 (six) hours. (Patient not taking: Reported on 03/07/2015) 30 tablet 1  . oxyCODONE-acetaminophen (PERCOCET/ROXICET) 5-325 MG per tablet Take 1 tablet by mouth every 4 (four) hours as needed (for pain scale 4-7). 30 tablet 0  . ranitidine (ZANTAC) 75 MG tablet Take 75 mg by mouth daily as needed for heartburn.     No facility-administered medications prior to visit.    PAST MEDICAL HISTORY: Past Medical History  Diagnosis Date  . Asthma   . Preterm contractions     2 prior pregnancies    PAST SURGICAL HISTORY: Past Surgical History  Procedure Laterality Date  . Hernia repair    . Cesarean section    . Tendon repair    . Cesarean section N/A 11/27/2014    Procedure: REPEAT CESAREAN SECTION;  Surgeon: Shea Evans, MD;  Location: [facility];  Service: Obstetrics;  Laterality: N/A;    FAMILY HISTORY: Family History  Problem Relation Age of Onset  . Cancer Paternal Grandmother     COLON  . Hypertension Paternal Grandfather   . Heart disease Paternal Grandfather   . Depression Mother   . Heart attack Father   . Colon cancer Father   . Asthma Father     SOCIAL HISTORY:  Social History   Social History  . Marital Status: Married    Spouse Name: Christiane Ha  . Number of Children: 3  . Years of Education: 16   Occupational History  .      [employer]   Social History Main Topics  . Smoking  status: Never Smoker   . Smokeless tobacco: Never Used  . Alcohol Use: Yes     Comment: socially  . Drug Use: No  . Sexual Activity: Yes    Birth Control/ Protection: None   Other Topics Concern  . Not on file   Social History Narrative   Lives at home with husband, 3 children   Caffeine use- coffee 1 cup daily     PHYSICAL EXAM  GENERAL EXAM/CONSTITUTIONAL: Vitals:  Filed Vitals:   03/07/15 0808  BP: 117/75  Pulse: 101  Height: 5' (1.524 m)  Weight: 160 lb 12.8 oz (72.938 kg)     Body mass index is  31.4 kg/(m^2).  Visual Acuity Screening   Right eye Left eye Both eyes  Without correction:     With correction: 20/20 20/20      Patient is in no distress; well developed, nourished and groomed; neck is supple  CARDIOVASCULAR:  Examination of carotid arteries is normal; no carotid bruits  Regular rate and rhythm, no murmurs  Examination of peripheral vascular system by observation and palpation is normal  EYES:  Ophthalmoscopic exam of optic discs and posterior segments is normal; no papilledema or hemorrhages  MUSCULOSKELETAL:  Gait, strength, tone, movements noted in Neurologic exam below  NEUROLOGIC: MENTAL STATUS:  No flowsheet data found.  awake, alert, oriented to person, place and time  recent and remote memory intact  normal attention and concentration  language fluent, comprehension intact, naming intact,   fund of knowledge appropriate  CRANIAL NERVE:   2nd - no papilledema on fundoscopic exam  2nd, 3rd, 4th, 6th - pupils equal and reactive to light, visual fields full to confrontation, extraocular muscles intact, no nystagmus  5th - facial sensation symmetric  7th - facial strength symmetric  8th - hearing intact  9th - palate elevates symmetrically, uvula midline  11th - shoulder shrug symmetric  12th - tongue protrusion midline  MOTOR:   normal bulk and tone, full strength in the BUE, BLE  SENSORY:   normal and symmetric to light touch, pinprick, temperature, vibration  COORDINATION:   finger-nose-finger, fine finger movements normal  REFLEXES:   deep tendon reflexes present and symmetric  GAIT/STATION:   narrow based gait; able to walk tandem; romberg is negative    DIAGNOSTIC DATA (LABS, IMAGING, TESTING) - I reviewed patient records, labs, notes, testing and imaging myself where available.  Lab Results  Component Value Date   WBC 10.4 11/28/2014   HGB 12.7 11/28/2014   HCT 37.5 11/28/2014   MCV 87.8 11/28/2014    PLT 168 11/28/2014      Component Value Date/Time   NA 137 02/09/2014 2232   K 3.7 02/09/2014 2232   CL 109 02/09/2014 2232   CO2 23 02/05/2014 1945   GLUCOSE 96 02/09/2014 2232   BUN 9 02/09/2014 2232   CREATININE 0.60 02/09/2014 2232   CALCIUM 9.7 02/05/2014 1945   PROT 8.3 02/05/2014 1945   ALBUMIN 3.9 02/05/2014 1945   AST 29 02/05/2014 1945   ALT 11 02/05/2014 1945   ALKPHOS 60 02/05/2014 1945   BILITOT 0.3 02/05/2014 1945   GFRNONAA >90 02/05/2014 1945   GFRAA >90 02/05/2014 1945   No results found for: CHOL, HDL, LDLCALC, LDLDIRECT, TRIG, CHOLHDL No results found for: WUJW1X No results found for: VITAMINB12 No results found for: TSH   CT head [I reviewed images myself and agree with interpretation. -VRP]  - negative   ASSESSMENT AND PLAN  28 y.o.  year old female here with long-standing history of migraine headaches without aura, now with 4 months of increasing headaches with tension type features. Considerations would include tension headache, pseudotumor cerebri, migraine variant, or other secondary cause. We'll check MRI of the brain. Will refer to ophthalmology for detailed funduscopic exam. Will check sleep study to rule out sleep apnea.   Dx:  Tension headache - Plan: MR Brain Wo Contrast  Migraine without aura and without status migrainosus, not intractable - Plan: MR Brain Wo Contrast    PLAN: - check MRI brain  - check ophthalmology exam - continue naproxen; avoid overuse - check sleep study (snoring, excessive daytime sleepiness, headaches)  Orders Placed This Encounter  Procedures  . MR Brain Wo Contrast  . Ambulatory referral to Sleep Studies   Return in about 6 weeks (around 04/18/2015).    Suanne MarkerVIKRAM R. Tmya Wigington, MD 03/07/2015, 8:58 AM Certified in Neurology, Neurophysiology and Neuroimaging  [facility] [address], [address] [place], [place] [address] [phone]

## 2015-04-03 ENCOUNTER — Ambulatory Visit (INDEPENDENT_AMBULATORY_CARE_PROVIDER_SITE_OTHER): Payer: BLUE CROSS/BLUE SHIELD | Admitting: Neurology

## 2015-04-03 ENCOUNTER — Encounter: Payer: Self-pay | Admitting: Neurology

## 2015-04-03 VITALS — BP 116/70 | HR 78 | Resp 16 | Ht 60.0 in | Wt 161.0 lb

## 2015-04-03 DIAGNOSIS — R519 Headache, unspecified: Secondary | ICD-10-CM

## 2015-04-03 DIAGNOSIS — G4719 Other hypersomnia: Secondary | ICD-10-CM | POA: Diagnosis not present

## 2015-04-03 DIAGNOSIS — R0683 Snoring: Secondary | ICD-10-CM

## 2015-04-03 DIAGNOSIS — R51 Headache: Secondary | ICD-10-CM

## 2015-04-03 NOTE — Progress Notes (Signed)
Subjective:    Patient ID: Margaret Lucero is a 29 y.o. female.  HPI     Huston Foley, MD, PhD [facility] [address] [address] [place], [place] [address]  Dear Satira Sark,   I saw your patient, Margaret Lucero, upon your kind request in my clinic today for initial consultation of her sleep disorder, in particular, concern for underlying obstructive sleep apnea. The patient is unaccompanied today. As you know, Margaret Lucero is a 29 year old right-handed woman with an underlying medical history of asthma, reflux disease, migraine headaches and obesity, who reports snoring and excessive daytime somnolence as well as morning headaches. Her Epworth sleepiness score is 12 out of 24 today, her fatigue score is 35 out of 63. She works full-time as an Environmental health practitioner. Of note, she has 3 small children, ages 60, 3 and 4 months. She currently is breast-feeding her little one and usually palms at night but does not nurse the baby at night. She usually pumps once at 3 AM. Bedtime is around 8:30 AM and she does not have trouble falling asleep but sometimes has trouble going back to sleep. She has no significant nocturia. She often wakes up with a headache. She does not take any prescription medication for her headaches at this time. Rise time is 5:30. She drinks alcohol very occasionally. She is a nonsmoker. She drinks caffeine in the form of coffee, one cup per day.she has no family history of OSA. She denies any restless leg symptoms or PLMD. Her husband has noted loud snoring. She has woken herself up with a sense of snoring. I reviewed your office note from 03/07/2015. She had tried a prednisone dose pack a few weeks ago, which helped a little.   Her Past Medical History Is Significant For: Past Medical History  Diagnosis Date  . Asthma   . Preterm contractions     2 prior pregnancies  . Depression     Her Past Surgical History Is Significant For: Past Surgical  History  Procedure Laterality Date  . Hernia repair    . Cesarean section    . Tendon repair    . Cesarean section N/A 11/27/2014    Procedure: REPEAT CESAREAN SECTION;  Surgeon: Shea Evans, MD;  Location: [facility];  Service: Obstetrics;  Laterality: N/A;  . Dilation and curettage of uterus    . Tonsillectomy    . Wisdom tooth extraction    . Finger surgery    . Shoulder surgery Right 2015    Her Family History Is Significant For: Family History  Problem Relation Age of Onset  . Cancer Paternal Grandmother     COLON  . Hypertension Paternal Grandfather   . Heart disease Paternal Grandfather   . Diabetes Paternal Grandfather   . Depression Mother   . Heart attack Father   . Colon cancer Father   . Asthma Father   . Colon cancer Maternal Grandmother   . Liver cancer Maternal Grandmother   . Diabetes Maternal Grandmother     Her Social History Is Significant For: Social History   Social History  . Marital Status: Married    Spouse Name: Christiane Ha  . Number of Children: 3  . Years of Education: 16   Occupational History  . [employer]     [employer]   Social History Main Topics  . Smoking status: Never Smoker   . Smokeless tobacco: Never Used  . Alcohol Use: 0.0 oz/week    0 Standard drinks  or equivalent per week     Comment: socially  . Drug Use: No  . Sexual Activity: Yes    Birth Control/ Protection: None   Other Topics Concern  . None   Social History Narrative   Lives at home with husband, 3 children   Caffeine use- coffee 1 cup daily    Her Allergies Are:  Allergies  Allergen Reactions  . Shellfish Allergy Hives and Shortness Of Breath  . Iodine Hives  :   Her Current Medications Are:  Outpatient Encounter Prescriptions as of 04/03/2015  Medication Sig  . acetaminophen (TYLENOL) 500 MG tablet Take 1,000 mg by mouth every 6 (six) hours as needed for mild pain or headache.   . albuterol (PROVENTIL HFA;VENTOLIN HFA) 108 (90 BASE) MCG/ACT inhaler  Inhale 2 puffs into the lungs every 6 (six) hours as needed for wheezing or shortness of breath.  Marland Kitchen. ibuprofen (ADVIL,MOTRIN) 600 MG tablet Take 1 tablet (600 mg total) by mouth every 6 (six) hours.  . Prenatal Vit-Fe Fumarate-FA (PRENATAL MULTIVITAMIN) TABS tablet Take 1 tablet by mouth.   . [DISCONTINUED] naproxen (NAPROSYN) 500 MG tablet 500 mg.  . [DISCONTINUED] predniSONE (DELTASONE) 10 MG tablet 10 mg.   No facility-administered encounter medications on file as of 04/03/2015.  : Review of Systems:  Out of a complete 14 point review of systems, all are reviewed and negative with the exception of these symptoms as listed below:   Review of Systems  Constitutional: Positive for fatigue.  HENT: Positive for tinnitus.   Allergic/Immunologic: Positive for environmental allergies.  Neurological: Positive for headaches.       Has trouble staying asleep, snoring, wakes up choking, wakes up feeling tired, daytime tiredness, morning headaches, only takes naps on weekend  Epworth Sleepiness Scale 0= would never doze 1= slight chance of dozing 2= moderate chance of dozing 3= high chance of dozing  Sitting and reading:1 Watching TV:3 Sitting inactive in a public place (ex. Theater or meeting):1 As a passenger in a car for an hour without a break:3 Lying down to rest in the afternoon:3 Sitting and talking to someone:1 Sitting quietly after lunch (no alcohol):0 In a car, while stopped in traffic:0 Total:12  Objective:  Neurologic Exam  Physical Exam Physical Examination:   Filed Vitals:   04/03/15 1331  BP: 116/70  Pulse: 78  Resp: 16    General Examination: The patient is a very pleasant 29 y.o. female in no acute distress. She appears well-developed and well-nourished and well groomed.   HEENT: Normocephalic, atraumatic, pupils are equal, round and reactive to light and accommodation. Funduscopic exam is normal with sharp disc margins noted. Extraocular tracking is good without  limitation to gaze excursion or nystagmus noted. Normal smooth pursuit is noted. Hearing is grossly intact. Face is symmetric with normal facial animation and normal facial sensation. Speech is clear with no dysarthria noted. There is no hypophonia. There is no lip, neck/head, jaw or voice tremor. Neck is supple with full range of passive and active motion. There are no carotid bruits on auscultation. Oropharynx exam reveals: mild mouth dryness, good dental hygiene and mild airway crowding, due to narrow airway entry and redundant soft palate. Uvula is small. Tonsils are absent. Mallampati is class I. Tongue protrudes centrally and palate elevates symmetrically. Neck size is 13.5 inches. She has a Mild overbite. Nasal inspection reveals no significant nasal mucosal bogginess or redness and no septal deviation.   Chest: Clear to auscultation without wheezing, rhonchi or crackles  noted.  Heart: S1+S2+0, regular and normal without murmurs, rubs or gallops noted.   Abdomen: Soft, non-tender and non-distended with normal bowel sounds appreciated on auscultation.  Extremities: There is no pitting edema in the distal lower extremities bilaterally. Pedal pulses are intact.  Skin: Warm and dry without trophic changes noted. There are no varicose veins.  Musculoskeletal: exam reveals no obvious joint deformities, tenderness or joint swelling or erythema.   Neurologically:  Mental status: The patient is awake, alert and oriented in all 4 spheres. Her immediate and remote memory, attention, language skills and fund of knowledge are appropriate. There is no evidence of aphasia, agnosia, apraxia or anomia. Speech is clear with normal prosody and enunciation. Thought process is linear. Mood is normal and affect is normal.  Cranial nerves II - XII are as described above under HEENT exam. In addition: shoulder shrug is normal with equal shoulder height noted. Motor exam: Normal bulk, strength and tone is noted.  There is no drift, tremor or rebound. Romberg is negative. Reflexes are 2+ throughout. Babinski: Toes are flexor bilaterally. Fine motor skills and coordination: intact with normal finger taps, normal hand movements, normal rapid alternating patting, normal foot taps and normal foot agility.  Cerebellar testing: No dysmetria or intention tremor on finger to nose testing. Heel to shin is unremarkable bilaterally. There is no truncal or gait ataxia.  Sensory exam: intact to light touch, pinprick, vibration, temperature sense in the upper and lower extremities.  Gait, station and balance: She stands easily. No veering to one side is noted. No leaning to one side is noted. Posture is age-appropriate and stance is narrow based. Gait shows normal stride length and normal pace. No problems turning are noted. She turns en bloc. Tandem walk is unremarkable.     Assessment and Plan:   In summary, Margaret Lucero is a very pleasant 29 y.o.-year old female with an underlying medical history of asthma, reflux disease, migraine headaches and obesity, whose history and physical exam are indeed concerning for obstructive sleep apnea (OSA). I had a long chat with the patient about my findings and the diagnosis of OSA, its prognosis and treatment options. We talked about medical treatments, surgical interventions and non-pharmacological approaches. I explained in particular the risks and ramifications of untreated moderate to severe OSA, especially with respect to developing cardiovascular disease down the Road, including congestive heart failure, difficult to treat hypertension, cardiac arrhythmias, or stroke. Even type 2 diabetes has, in part, been linked to untreated OSA. Symptoms of untreated OSA include daytime sleepiness, memory problems, mood irritability and mood disorder such as depression and anxiety, lack of energy, as well as recurrent headaches, especially morning headaches. We talked about trying to maintain a  healthy lifestyle in general, as well as the importance of weight control. I encouraged the patient to eat healthy, exercise daily and keep well hydrated, to keep a scheduled bedtime and wake time routine, to not skip any meals and eat healthy snacks in between meals. I advised the patient not to drive when feeling sleepy. I recommended the following at this time: sleep study with potential positive airway pressure titration. (We will score hypopneas at 3% and split the sleep study into diagnostic and treatment portion, if the estimated. 2 hour AHI is >15/h). She is going to be able to go without pumping 1 night. She is advised to try to pump breastmilk before coming for the sleep study and when she gets home. She is agreeable to this.  I explained the sleep test procedure to the patient and also outlined possible surgical and non-surgical treatment options of OSA, including the use of a custom-made dental device (which would require a referral to a specialist dentist or oral surgeon), upper airway surgical options, such as pillar implants, radiofrequency surgery, tongue base surgery, and UPPP (which would involve a referral to an ENT surgeon). Rarely, jaw surgery such as mandibular advancement may be considered.  I also explained the CPAP treatment option to the patient, who indicated that she would be willing to try CPAP if the need arises. I explained the importance of being compliant with PAP treatment, not only for insurance purposes but primarily to improve Her symptoms, and for the patient's long term health benefit, including to reduce Her cardiovascular risks. I answered all her questions today and the patient was in agreement. I would like to see her back after the sleep study is completed and encouraged her to call with any interim questions, concerns, problems or updates.   Thank you very much for allowing me to participate in the care of this nice patient. If I can be of any further assistance  to you please do not hesitate to talk to me.   Sincerely,   Huston Foley, MD, PhD

## 2015-04-03 NOTE — Patient Instructions (Signed)

## 2015-04-04 ENCOUNTER — Ambulatory Visit (INDEPENDENT_AMBULATORY_CARE_PROVIDER_SITE_OTHER): Payer: BLUE CROSS/BLUE SHIELD

## 2015-04-04 DIAGNOSIS — G44209 Tension-type headache, unspecified, not intractable: Secondary | ICD-10-CM

## 2015-04-04 DIAGNOSIS — G43009 Migraine without aura, not intractable, without status migrainosus: Secondary | ICD-10-CM

## 2015-04-20 ENCOUNTER — Ambulatory Visit (INDEPENDENT_AMBULATORY_CARE_PROVIDER_SITE_OTHER): Payer: BLUE CROSS/BLUE SHIELD | Admitting: Neurology

## 2015-04-20 DIAGNOSIS — G4720 Circadian rhythm sleep disorder, unspecified type: Secondary | ICD-10-CM

## 2015-04-20 DIAGNOSIS — G4710 Hypersomnia, unspecified: Secondary | ICD-10-CM

## 2015-04-21 NOTE — Sleep Study (Signed)
Please see the scanned sleep study interpretation located in the procedure tab within the chart review section.   

## 2015-04-23 ENCOUNTER — Telehealth: Payer: Self-pay | Admitting: Neurology

## 2015-04-23 NOTE — Telephone Encounter (Signed)
Patient referred by Dr. Marjory LiesPenumalli, seen by me on 04/03/15, diagnostic PSG on 04/20/15.   Please call and notify the patient that the recent sleep study did not show any significant obstructive sleep apnea or leg twitching or any obvious underlying organic sleep disorder. Please inform patient that I would like to go over the details of the study during a follow up appointment. Arrange a followup appointment. Also, route or fax report to PCP and referring MD, if other than PCP.  Once you have spoken to patient, you can close this encounter.   Please remind patient to maintain good sleep hygiene, which means: Keep a regular sleep and wake schedule, try not to exercise or have a meal within 2 hours of your bedtime, try to keep your bedroom conducive for sleep, that is, cool and dark, without light distractors such as an illuminated alarm clock, and refrain from watching TV right before sleep or in the middle of the night and do not keep the TV or radio on during the night. Also, try not to use or play on electronic devices at bedtime, such as your cell phone, tablet PC or laptop. If you like to read at bedtime on an electronic device, try to dim the background light as much as possible. Do not eat in the middle of the night.   Thanks,  Huston FoleySaima Daje Stark, MD, PhD [facility]

## 2015-04-24 NOTE — Telephone Encounter (Signed)
I spoke to patient and she is aware of results and recommendations. She made appt for 05/10/15. I will mail here a good sleep hygiene handout.

## 2015-04-26 ENCOUNTER — Telehealth: Payer: Self-pay | Admitting: Neurology

## 2015-04-26 ENCOUNTER — Encounter: Payer: Self-pay | Admitting: Diagnostic Neuroimaging

## 2015-04-26 ENCOUNTER — Ambulatory Visit (INDEPENDENT_AMBULATORY_CARE_PROVIDER_SITE_OTHER): Payer: BLUE CROSS/BLUE SHIELD | Admitting: Diagnostic Neuroimaging

## 2015-04-26 VITALS — BP 100/62 | HR 86 | Ht 60.0 in | Wt 162.2 lb

## 2015-04-26 DIAGNOSIS — G43009 Migraine without aura, not intractable, without status migrainosus: Secondary | ICD-10-CM | POA: Diagnosis not present

## 2015-04-26 DIAGNOSIS — G44209 Tension-type headache, unspecified, not intractable: Secondary | ICD-10-CM

## 2015-04-26 NOTE — Telephone Encounter (Signed)
Pt check in for follow up appt today with Dr.Penumalli.

## 2015-04-26 NOTE — Progress Notes (Signed)
[facility]  PATIENT: Margaret Lucero DOB: [birth year]  REFERRING CLINICIAN: Herold HarmsBeal  HISTORY FROM: patient  REASON FOR VISIT: follow up    HISTORICAL  CHIEF COMPLAINT:  Chief Complaint  Patient presents with  . Follow-up    headaches    HISTORY OF PRESENT ILLNESS:   UPDATE 04/26/15: Since with 4-5 HA per week; tension and squeezing. MRI brain normal. Sleep study unremarkable. Eye exam next week.   PRIOR HPI (03/07/15): 29 year old right-handed female here for evaluation of headaches. Around June 2016 patient was in third term of pregnancy, and developed headaches. July 25 she delivered healthy baby. Headaches continued. Now patient having increasing headaches, ringing in ears, nausea. Patient describes pressure sensation back of head, sharp pain in her temples, and top of her head. No photophobia or phonophobia. Headaches last 15 minutes up to 2 hours a time. Headaches seem to be worse early in the morning. Sometimes having nausea. Patient is still nursing and therefore has not tried a Friday medications. Currently she is using naproxen as needed. Patient had occasional headaches in the past. In middle school she had migraine headaches with photophobia, throbbing sensation, nausea.   REVIEW OF SYSTEMS: Full 14 system review of systems performed and notable only for sleepiness snoring headache dizziness easy bruising lymph node enlargement constipation allergy skin sensitivity ringing in ears fatigue.  ALLERGIES: Allergies  Allergen Reactions  . Shellfish Allergy Hives and Shortness Of Breath  . Iodine Hives    HOME MEDICATIONS: Outpatient Prescriptions Prior to Visit  Medication Sig Dispense Refill  . acetaminophen (TYLENOL) 500 MG tablet Take 1,000 mg by mouth every 6 (six) hours as needed for mild pain or headache.     . albuterol (PROVENTIL HFA;VENTOLIN HFA) 108 (90 BASE) MCG/ACT inhaler Inhale 2 puffs into the lungs every 6 (six) hours as needed for  wheezing or shortness of breath.    Marland Kitchen. ibuprofen (ADVIL,MOTRIN) 600 MG tablet Take 1 tablet (600 mg total) by mouth every 6 (six) hours. 30 tablet 1  . Prenatal Vit-Fe Fumarate-FA (PRENATAL MULTIVITAMIN) TABS tablet Take 1 tablet by mouth.      No facility-administered medications prior to visit.    PAST MEDICAL HISTORY: Past Medical History  Diagnosis Date  . Asthma   . Preterm contractions     2 prior pregnancies  . Depression   . Headache     PAST SURGICAL HISTORY: Past Surgical History  Procedure Laterality Date  . Hernia repair    . Cesarean section    . Tendon repair    . Cesarean section N/A 11/27/2014    Procedure: REPEAT CESAREAN SECTION;  Surgeon: Shea EvansVaishali Mody, MD;  Location: [facility];  Service: Obstetrics;  Laterality: N/A;  . Dilation and curettage of uterus    . Tonsillectomy    . Wisdom tooth extraction    . Finger surgery    . Shoulder surgery Right 2015    FAMILY HISTORY: Family History  Problem Relation Age of Onset  . Cancer Paternal Grandmother     COLON  . Hypertension Paternal Grandfather   . Heart disease Paternal Grandfather   . Diabetes Paternal Grandfather   . Depression Mother   . Heart attack Father   . Colon cancer Father   . Asthma Father   . Colon cancer Maternal Grandmother   . Liver cancer Maternal Grandmother   . Diabetes Maternal Grandmother     SOCIAL HISTORY:  Social History   Social History  . Marital Status: Married  Spouse Name: Christiane Ha  . Number of Children: 3  . Years of Education: 16   Occupational History  . [employer]   Social History Main Topics  . Smoking status: Never Smoker   . Smokeless tobacco: Never Used  . Alcohol Use: 0.0 oz/week    0 Standard drinks or equivalent per week     Comment: socially  . Drug Use: No  . Sexual Activity: Yes    Birth Control/ Protection: None   Other Topics Concern  . Not on file   Social History Narrative   Lives at home with husband, 3 children    Caffeine use- coffee 1 cup daily     PHYSICAL EXAM  GENERAL EXAM/CONSTITUTIONAL: Vitals:  Filed Vitals:   04/26/15 1638  BP: 100/62  Pulse: 86  Height: 5' (1.524 m)  Weight: 162 lb 3.2 oz (73.573 kg)   Body mass index is 31.68 kg/(m^2). No exam data present  Patient is in no distress; well developed, nourished and groomed; neck is supple  CARDIOVASCULAR:  Examination of carotid arteries is normal; no carotid bruits  Regular rate and rhythm, no murmurs  Examination of peripheral vascular system by observation and palpation is normal  EYES:  Ophthalmoscopic exam of optic discs and posterior segments is normal; no papilledema or hemorrhages  MUSCULOSKELETAL:  Gait, strength, tone, movements noted in Neurologic exam below  NEUROLOGIC: MENTAL STATUS:  No flowsheet data found.  awake, alert, oriented to person, place and time  recent and remote memory intact  normal attention and concentration  language fluent, comprehension intact, naming intact,   fund of knowledge appropriate  CRANIAL NERVE:   2nd - no papilledema on fundoscopic exam  2nd, 3rd, 4th, 6th - pupils equal and reactive to light, visual fields full to confrontation, extraocular muscles intact, no nystagmus  5th - facial sensation symmetric  7th - facial strength symmetric  8th - hearing intact  9th - palate elevates symmetrically, uvula midline  11th - shoulder shrug symmetric  12th - tongue protrusion midline  MOTOR:   normal bulk and tone, full strength in the BUE, BLE  SENSORY:   normal and symmetric to light touch, pinprick, temperature, vibration  COORDINATION:   finger-nose-finger, fine finger movements normal  REFLEXES:   deep tendon reflexes present and symmetric  GAIT/STATION:   narrow based gait; able to walk tandem; romberg is negative    DIAGNOSTIC DATA (LABS, IMAGING, TESTING) - I reviewed patient records, labs, notes, testing and imaging myself where  available.  Lab Results  Component Value Date   WBC 10.4 11/28/2014   HGB 12.7 11/28/2014   HCT 37.5 11/28/2014   MCV 87.8 11/28/2014   PLT 168 11/28/2014      Component Value Date/Time   NA 137 02/09/2014 2232   K 3.7 02/09/2014 2232   CL 109 02/09/2014 2232   CO2 23 02/05/2014 1945   GLUCOSE 96 02/09/2014 2232   BUN 9 02/09/2014 2232   CREATININE 0.60 02/09/2014 2232   CALCIUM 9.7 02/05/2014 1945   PROT 8.3 02/05/2014 1945   ALBUMIN 3.9 02/05/2014 1945   AST 29 02/05/2014 1945   ALT 11 02/05/2014 1945   ALKPHOS 60 02/05/2014 1945   BILITOT 0.3 02/05/2014 1945   GFRNONAA >90 02/05/2014 1945   GFRAA >90 02/05/2014 1945   No results found for: CHOL, HDL, LDLCALC, LDLDIRECT, TRIG, CHOLHDL No results found for: BJYN8G No results found for: VITAMINB12 No results found for: TSH  CT head [I reviewed images myself and agree with interpretation. -VRP]  - negative  MRI brain  - normal    ASSESSMENT AND PLAN  29 y.o. year old female here with long-standing history of migraine headaches without aura, now with 4 months of increasing headaches with tension type features. Considerations would include tension headache, pseudotumor cerebri or migraine variant.    Dx:  Tension headache  Migraine without aura and without status migrainosus, not intractable   PLAN: - check ophthalmology exam; then may check LP - continue naproxen; avoid overuse  Return in about 3 months (around 07/25/2015).    Suanne Marker, MD 04/26/2015, 4:56 PM Certified in Neurology, Neurophysiology and Neuroimaging  [facility] [address], [address] [place], [place] [address] [phone]

## 2015-04-26 NOTE — Telephone Encounter (Signed)
Patient called 3:58pm to advise she is stuck in slow traffic, will arrive for 4:00pm appointment with Dr. Marjory LiesPenumalli  in approximately 10 minutes.

## 2015-04-26 NOTE — Patient Instructions (Signed)
Thank you for coming to see [place] at [facility]. I hope we have been able to provide you high quality care today.  You may receive a patient satisfaction survey over the next few weeks. We would appreciate your feedback and comments so that we may continue to improve ourselves and the health of our patients.  - follow up eye exam   ~~~~~~~~~~~~~~~~~~~~~~~~~~~~~~~~~~~~~~~~~~~~~~~~~~~~~~~~~~~~~~~~~  DR. PENUMALLI'S GUIDE TO HAPPY AND HEALTHY LIVING These are some of my general health and wellness recommendations. Some of them may apply to you better than others. Please use common sense as you try these suggestions and feel free to ask me any questions.   ACTIVITY/FITNESS Mental, social, emotional and physical stimulation are very important for brain and body health. Try learning a new activity (arts, music, language, sports, games).  Keep moving your body to the best of your abilities. You can do this at home, inside or outside, the park, community center, gym or anywhere you like. Consider a physical therapist or personal trainer to get started. Consider the app Sworkit. Fitness trackers such as smart-watches, smart-phones or Fitbits can help as well.   NUTRITION Eat more plants: colorful vegetables, nuts, seeds and berries.  Eat less sugar, salt, preservatives and processed foods.  Avoid toxins such as cigarettes and alcohol.  Drink water when you are thirsty. Warm water with a slice of lemon is an excellent morning drink to start the day.  Consider these websites for more information The Nutrition Source (https://www.henry-hernandez.biz/) Precision Nutrition (WindowBlog.ch)   RELAXATION Consider practicing mindfulness meditation or other relaxation techniques such as deep breathing, prayer, yoga, tai chi, massage. See website mindful.org or the apps Headspace or Calm to help get started.   SLEEP Try to get at least  7-8+ hours sleep per day. Regular exercise and reduced caffeine will help you sleep better. Practice good sleep hygeine techniques. See website sleep.org for more information.   PLANNING Prepare estate planning, living will, healthcare POA documents. Sometimes this is best planned with the help of an attorney. Theconversationproject.org and agingwithdignity.org are excellent resources.

## 2015-05-02 ENCOUNTER — Telehealth: Payer: Self-pay | Admitting: Diagnostic Neuroimaging

## 2015-05-02 NOTE — Telephone Encounter (Signed)
Patient called to advise "she saw eye Doctor and eye pressure Left eye 25, Right eye 22, eye Dr. wants to look into this further and patient wanted Dr. Marjory LiesPenumalli to be aware".

## 2015-05-02 NOTE — Telephone Encounter (Signed)
Noted  

## 2015-05-10 ENCOUNTER — Ambulatory Visit: Payer: Self-pay | Admitting: Neurology

## 2015-06-29 IMAGING — CT CT ABD-PELV W/ CM
2 of 4 series · 16 of 46 positions shown, 18 images · IV contrast (Omni 300)
Comparison: None.

CLINICAL DATA: Left upper quadrant and left lower quadrant
abdominal pain. Bloating. Nausea. Diarrhea. Symptoms for 2 days.
Personal history of C-section and hernia repairs.

EXAM:
CT ABDOMEN AND PELVIS WITH CONTRAST
TECHNIQUE: Multidetector CT imaging of the abdomen and pelvis was performed
using the standard protocol following bolus administration of
intravenous contrast.
CONTRAST:  100mL OMNIPAQUE IOHEXOL 300 MG/ML  SOLN

[Series 2: abd/ pelvis 5.0 i30f 1 · axial · 0.74mm/px · z∈[+1039,+1434]mm · 13 of 87 slices shown, 15 images]
[im 4/87  soft-tissue]
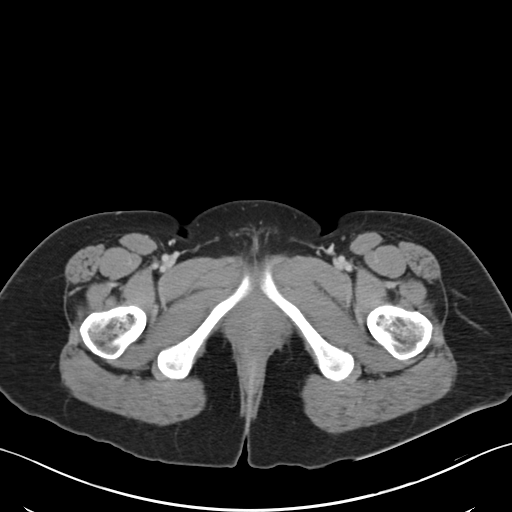
[im 4/87  bone]
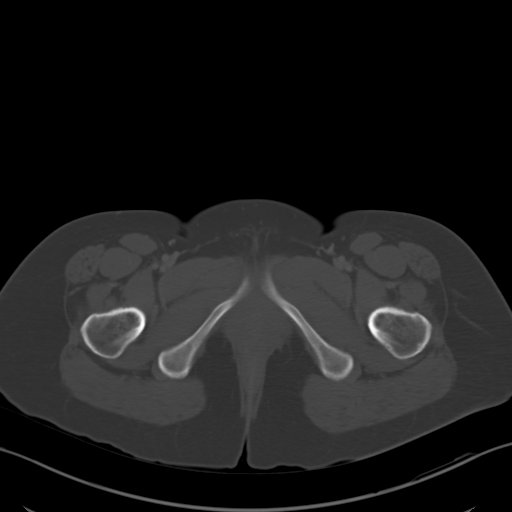
[im 11/87  soft-tissue]
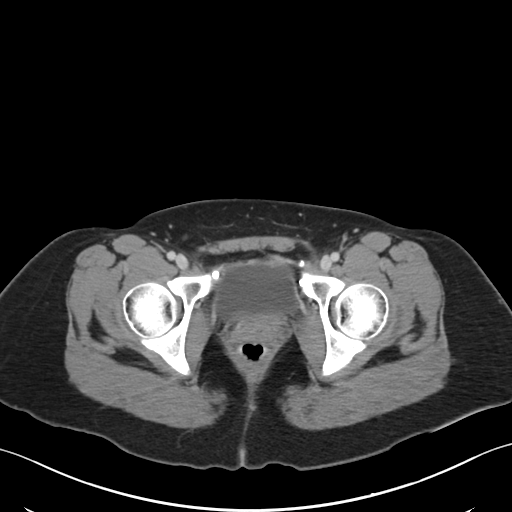
[im 18/87  soft-tissue]
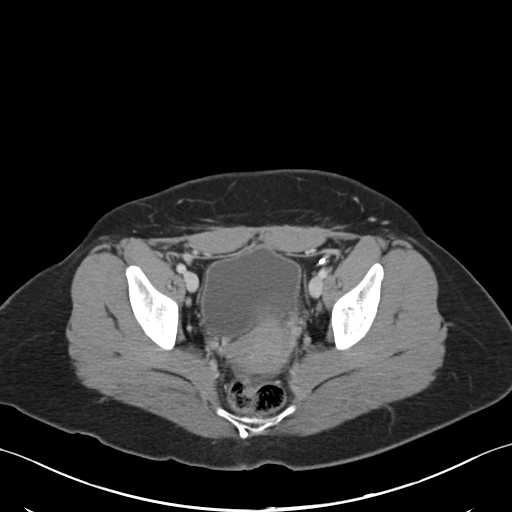
[im 26/87  soft-tissue]
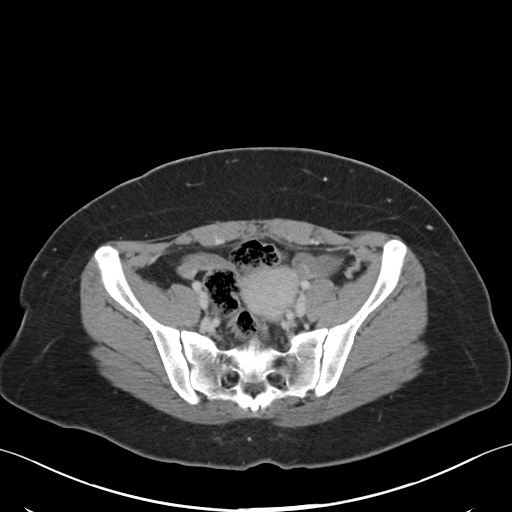
[im 29/87  soft-tissue]
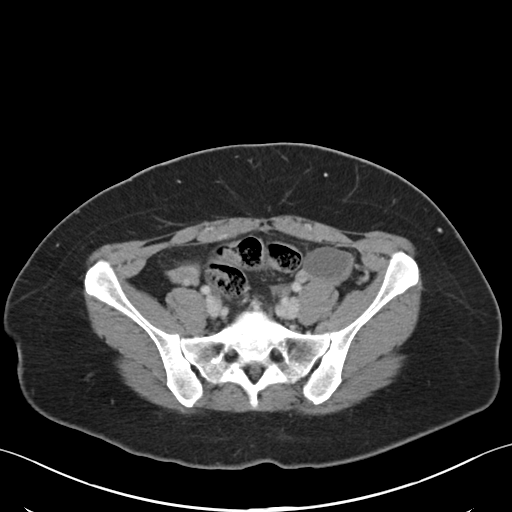
[im 36/87  soft-tissue]
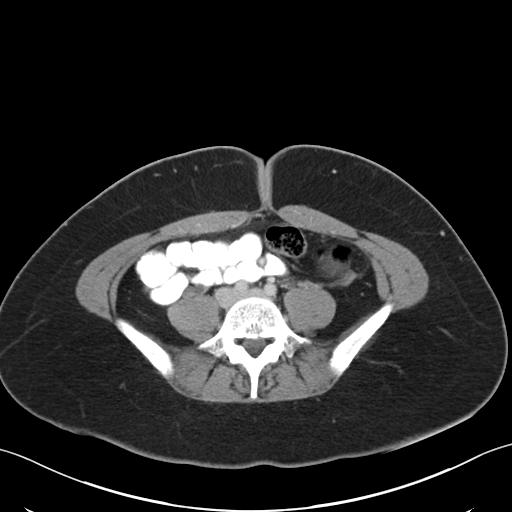
[im 44/87  soft-tissue]
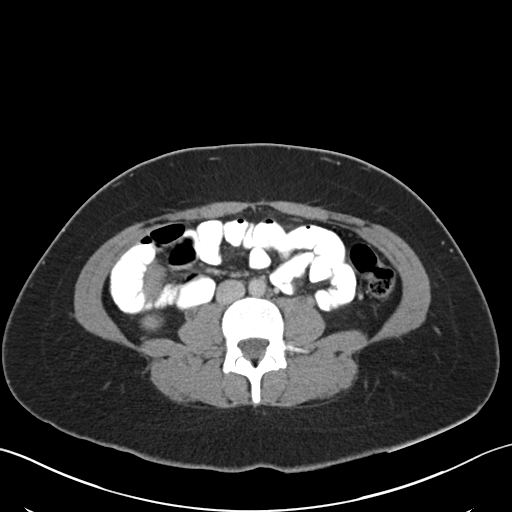
[im 51/87  soft-tissue]
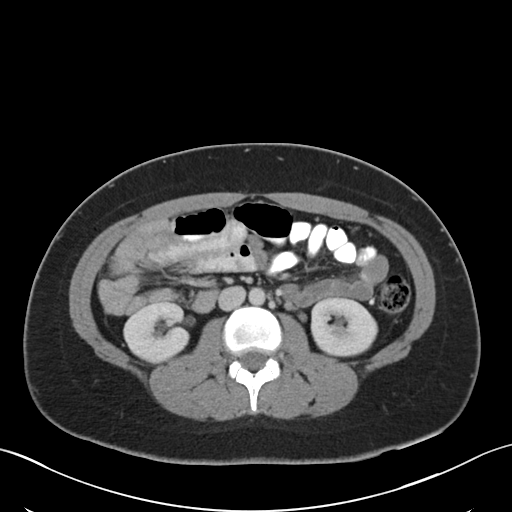
[im 58/87  soft-tissue]
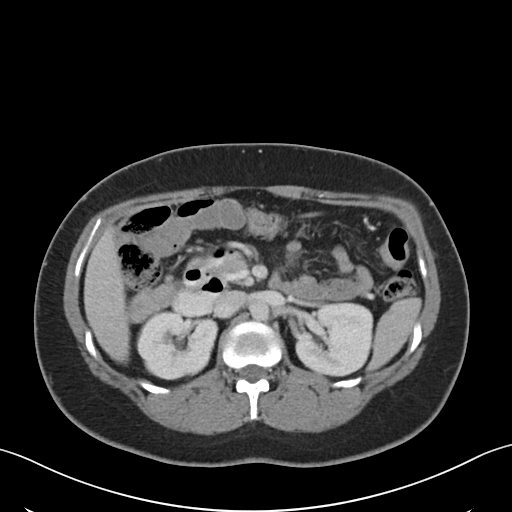
[im 58/87  bone]
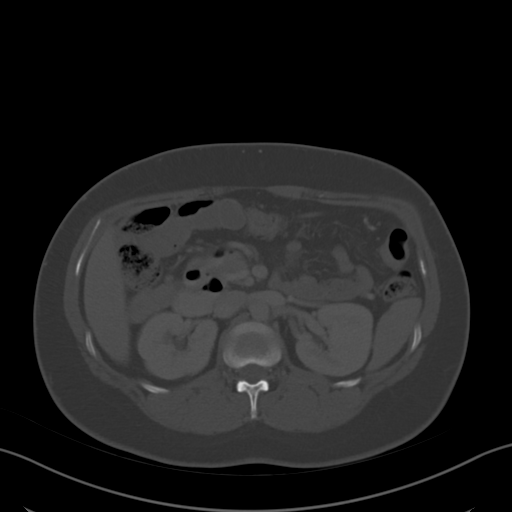
[im 61/87  soft-tissue]
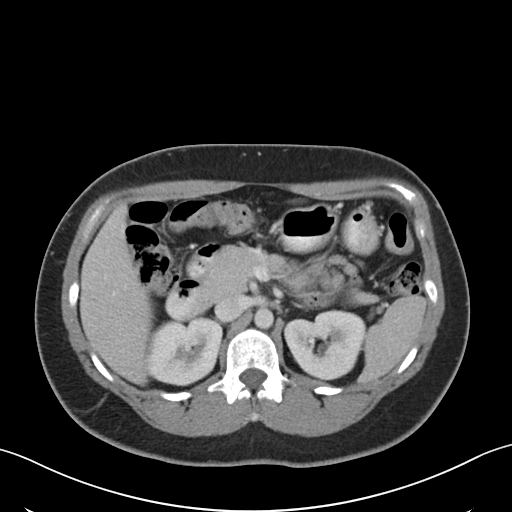
[im 69/87  soft-tissue]
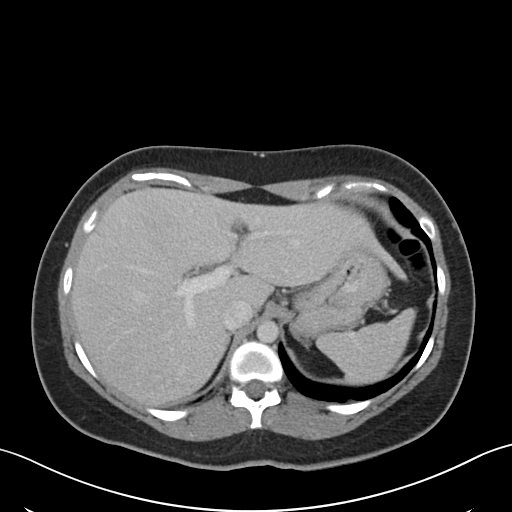
[im 76/87  soft-tissue]
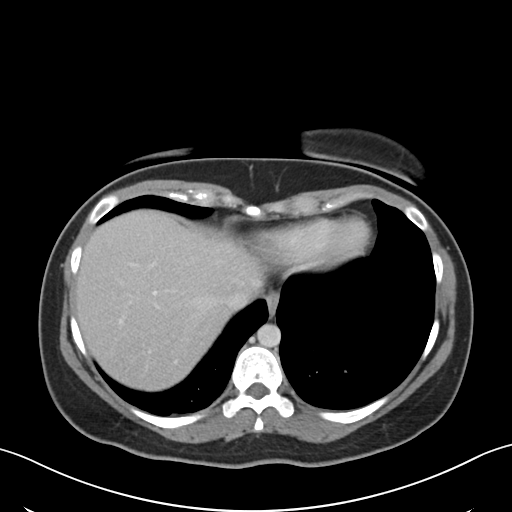
[im 83/87  soft-tissue]
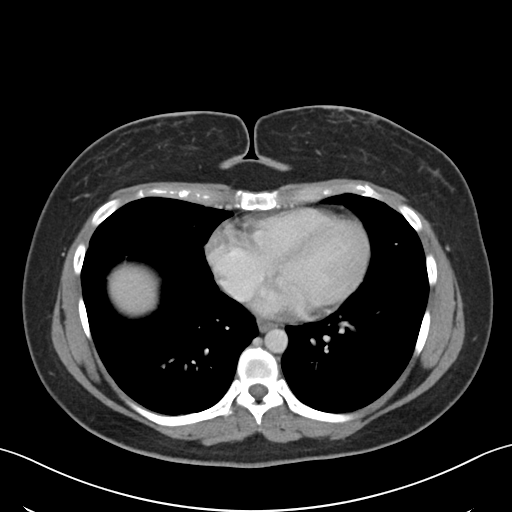

[Series 5: coronals · coronal · 0.66mm/px · 3 of 114 slices shown]
[im 38/114  soft-tissue]
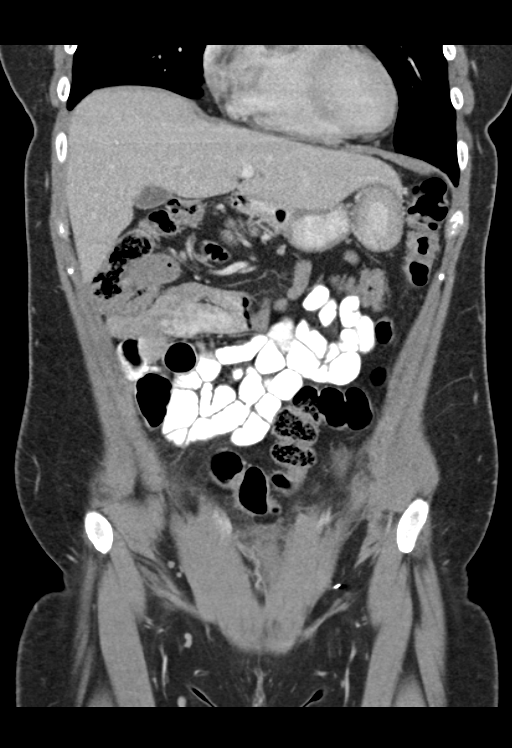
[im 51/114  soft-tissue]
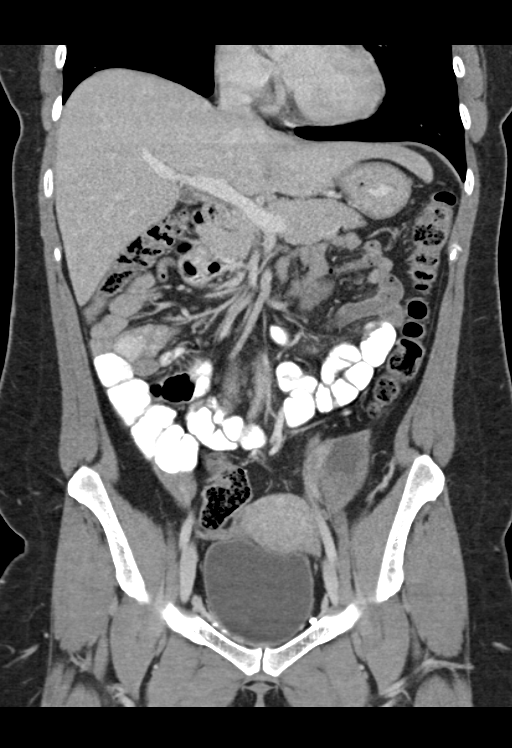
[im 63/114  soft-tissue]
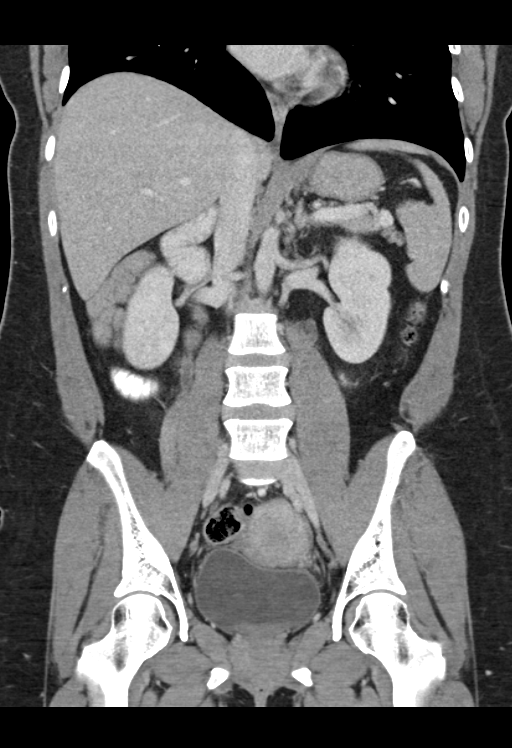

[16 of 46 positions shown; findings below may reference images not displayed]

FINDINGS: The lung bases are clear without focal nodule, mass, or airspace
disease. The heart size is normal. No significant pleural or
pericardial effusion is present.

The liver and spleen are within normal limits. The stomach,
duodenum, and pancreas are unremarkable. Common bile duct and
gallbladder are normal. The adrenal glands are normal bilaterally.
Kidneys and ureters are within normal limits. Urinary bladder is
within normal limits.

The rectosigmoid colon is within normal limits. Of the remainder the
colon is unremarkable. The appendix is visualized and normal. The
small bowel is unremarkable.

The uterus is within normal limits. A cystic lesion within the left
adnexa measures 3.9 x 2.9 x 4.8 cm. A small amount of free fluid
within the anatomic pelvis is likely physiologic. The right adnexa
is within normal limits. There is no significant adenopathy.

Bone windows are unremarkable.
IMPRESSION: 1. Cystic lesion in the left adnexa measures 3.9 x 2.9 x 4.8 cm.
This appears to be a benign cyst. Follow-up ultrasound at 6 or 10
weeks could be used for further evaluation.
2. Otherwise unremarkable CT of the abdomen and pelvis.

## 2015-06-29 IMAGING — US US PELVIS COMPLETE
1 series · 13 of 25 positions shown · non-contrast
Comparison: CT of the abdomen and pelvis performed earlier today at
[DATE] p.m.

CLINICAL DATA: Acute onset of left-sided pelvic pain. Bloating,
nausea and diarrhea. Symptoms for 2 days. Initial encounter.

EXAM:
TRANSABDOMINAL AND TRANSVAGINAL ULTRASOUND OF PELVIS
DOPPLER ULTRASOUND OF OVARIES
TECHNIQUE: Both transabdominal and transvaginal ultrasound examinations of the
pelvis were performed. Transabdominal technique was performed for
global imaging of the pelvis including uterus, ovaries, adnexal
regions, and pelvic cul-de-sac.
It was necessary to proceed with endovaginal exam following the
transabdominal exam to visualize the uterus and ovaries in greater
detail. Color and duplex Doppler ultrasound was utilized to evaluate
blood flow to the ovaries.

[Series 1: us pelvis complete · 0.20mm/px · 13 of 43 slices shown]
[im 1/43]
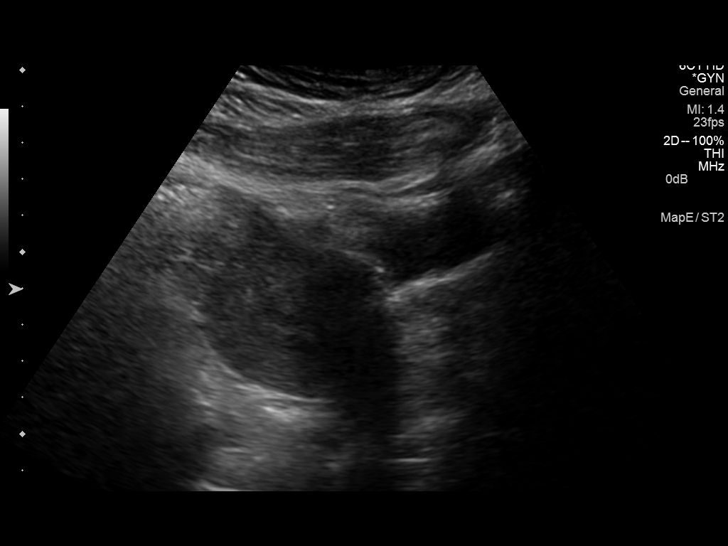
[im 4/43]
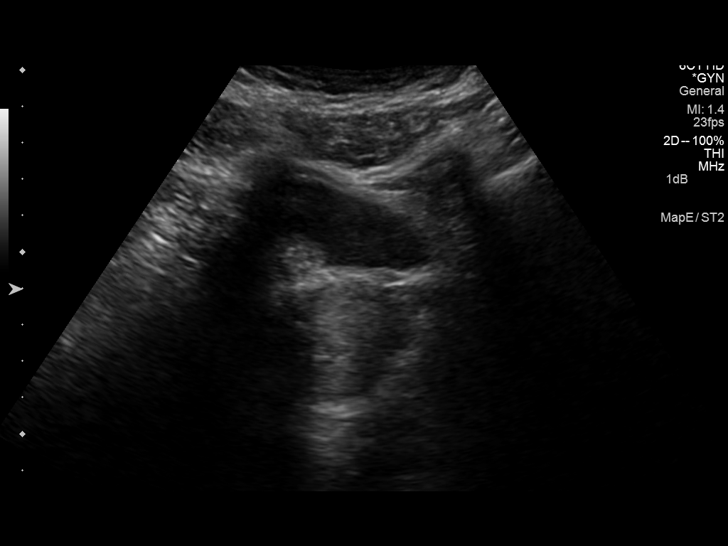
[im 8/43]
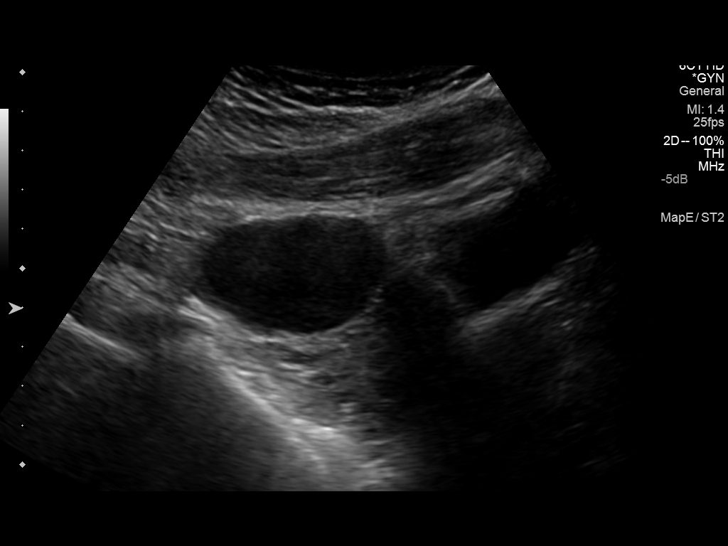
[im 11/43]
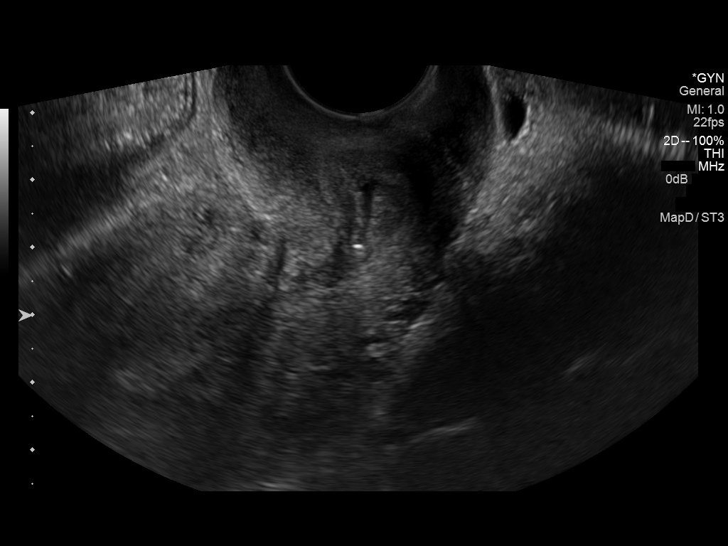
[im 15/43]
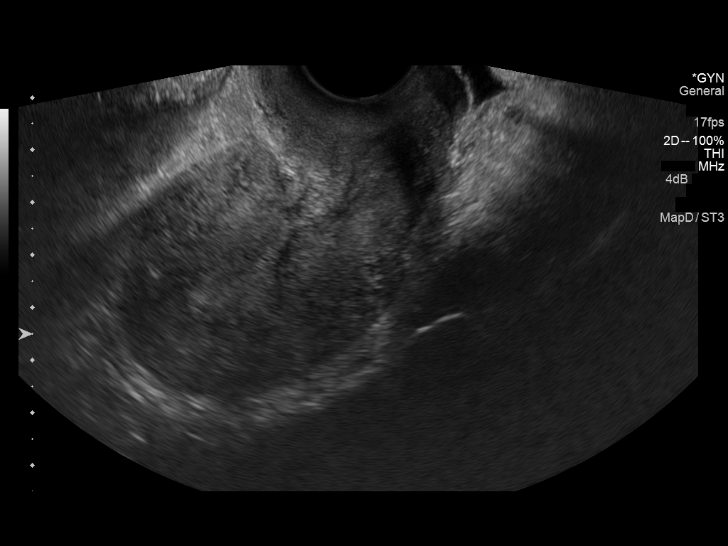
[im 18/43]
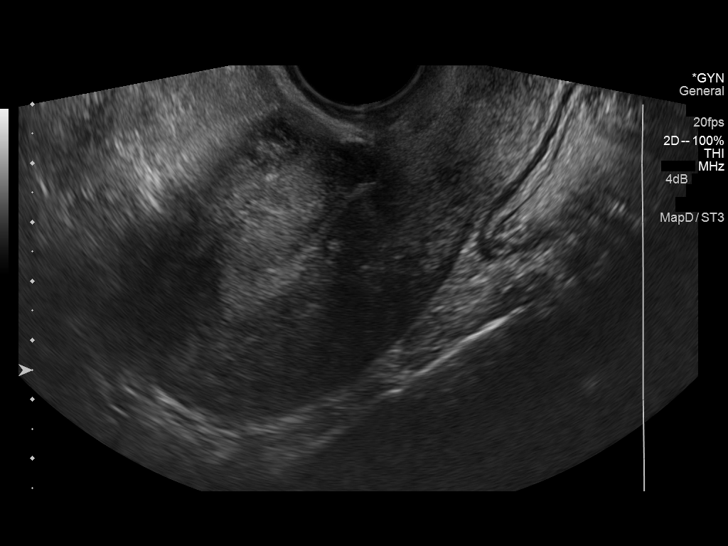
[im 22/43]
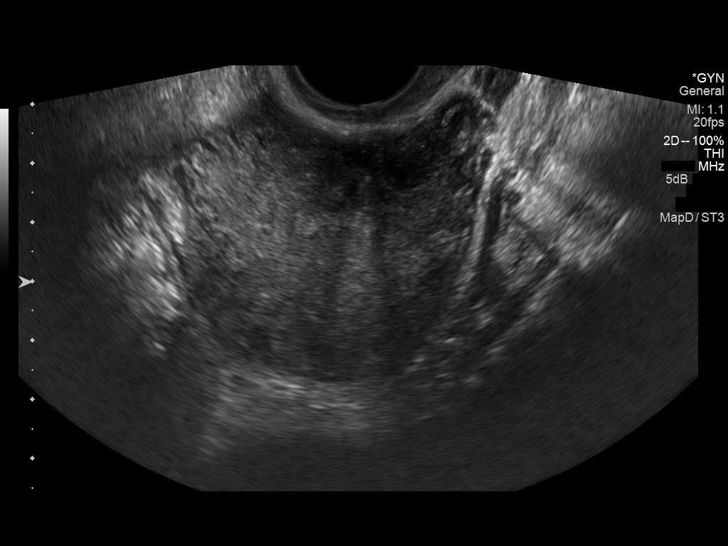
[im 25/43]
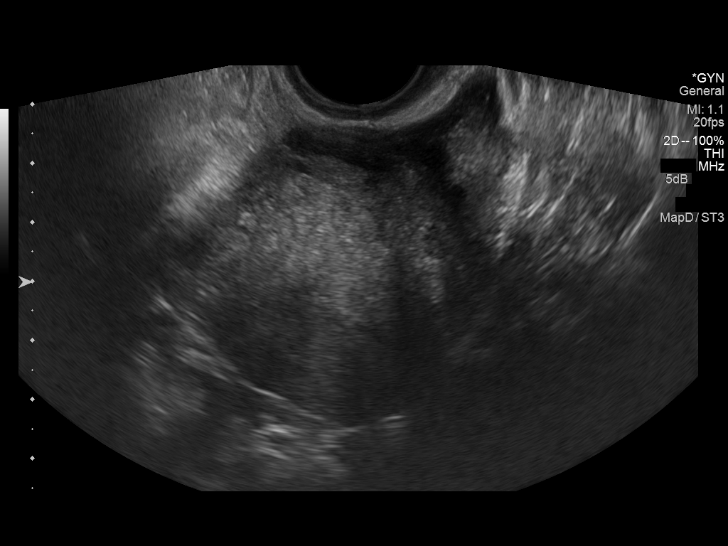
[im 29/43]
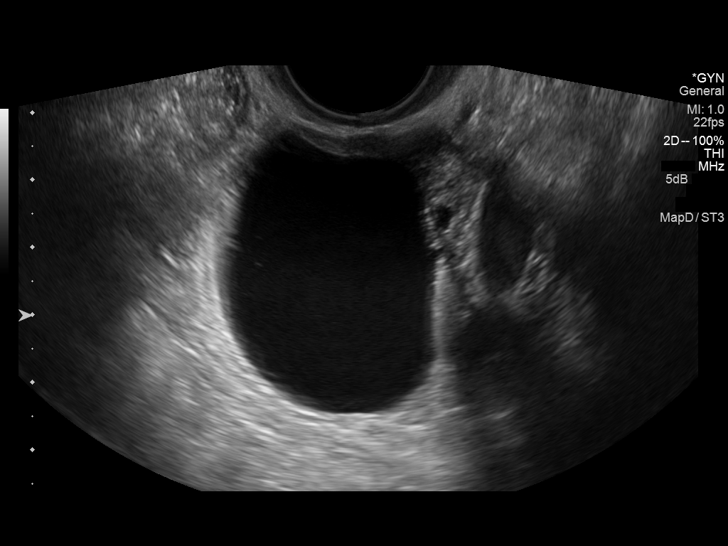
[im 32/43]
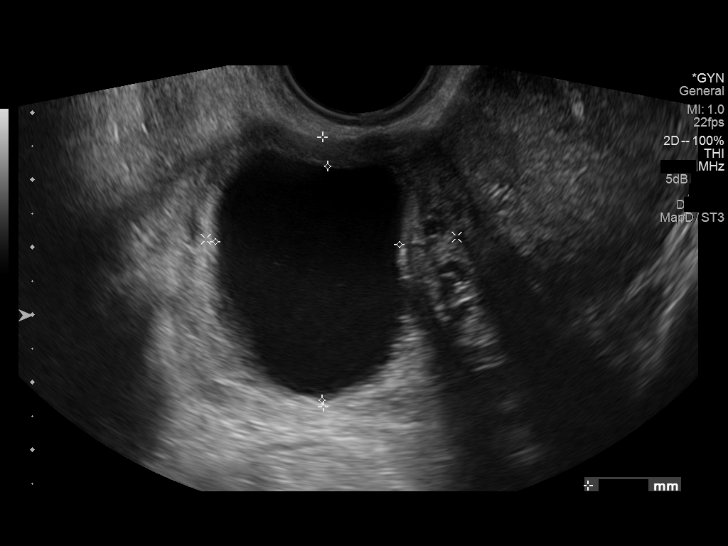
[im 36/43]
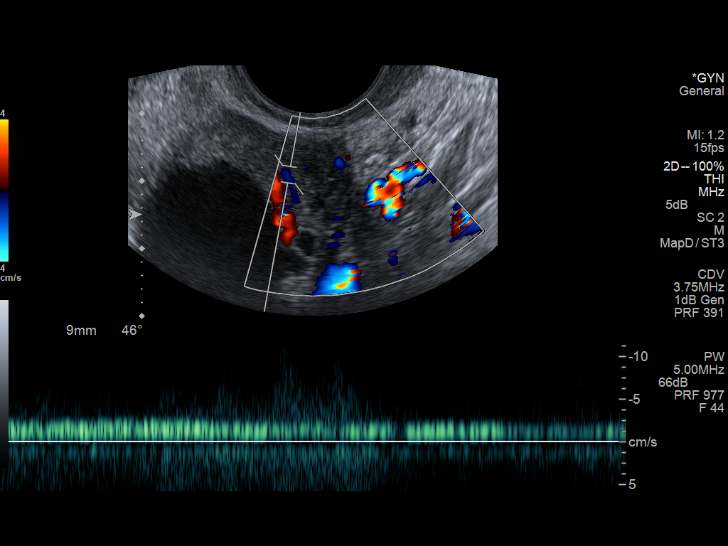
[im 39/43]
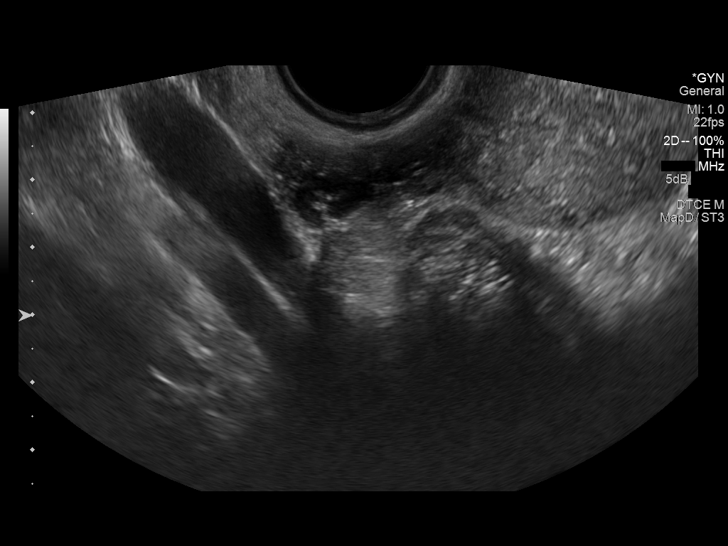
[im 43/43]
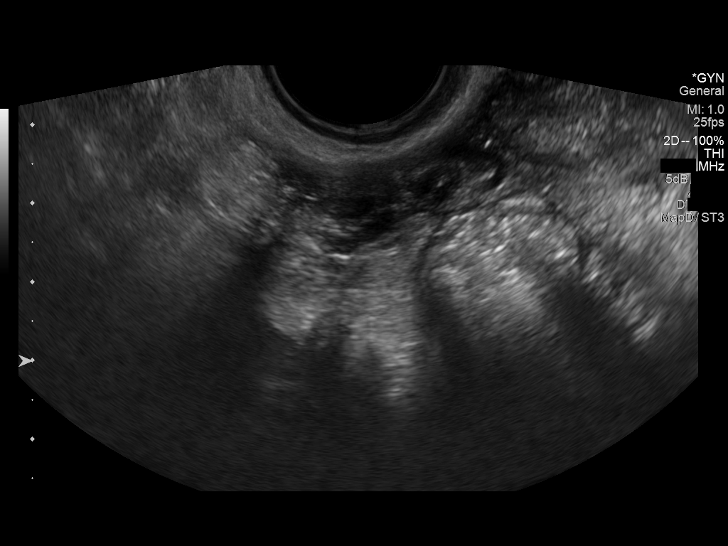

[13 of 25 positions shown; findings below may reference images not displayed]

FINDINGS: Uterus

Measurements: 8.4 x 4.6 x 5.0 cm. No fibroids or other mass
visualized.

Endometrium

Thickness: 0.4 cm.  No focal abnormality visualized.

Right ovary

Not visualized on this study.

Left ovary

Measurements: 4.0 x 3.7 x 3.7 cm. A simple cyst is noted at the left
ovary, measuring 3.5 x 2.7 x 2.9 cm.

Pulsed Doppler evaluation of both ovaries demonstrates normal
low-resistance arterial and venous waveforms.

Other findings

Trace free fluid is noted within the pelvic cul-de-sac.
IMPRESSION: 1. 3.5 cm simple cyst noted at the left ovary; this is likely
physiologic in nature, and does not require follow-up.
2. Unremarkable pelvic ultrasound.

## 2015-07-04 IMAGING — US US PELVIS COMPLETE
1 series · 13 of 25 positions shown · non-contrast
Comparison: Pelvic ultrasound performed 02/05/2014

CLINICAL DATA: Persistent left lower quadrant abdominal pain.
Assess for ovarian torsion. Follow-up abdominal pain.

EXAM:
TRANSABDOMINAL AND TRANSVAGINAL ULTRASOUND OF PELVIS
DOPPLER ULTRASOUND OF OVARIES
TECHNIQUE: Both transabdominal and transvaginal ultrasound examinations of the
pelvis were performed. Transabdominal technique was performed for
global imaging of the pelvis including uterus, ovaries, adnexal
regions, and pelvic cul-de-sac.
It was necessary to proceed with endovaginal exam following the
transabdominal exam to visualize the uterus and ovaries in greater
detail. Color and duplex Doppler ultrasound was utilized to evaluate
blood flow to the ovaries.

[Series 1: us pelvis complete · 0.24mm/px · 13 of 45 slices shown]
[im 1/45]
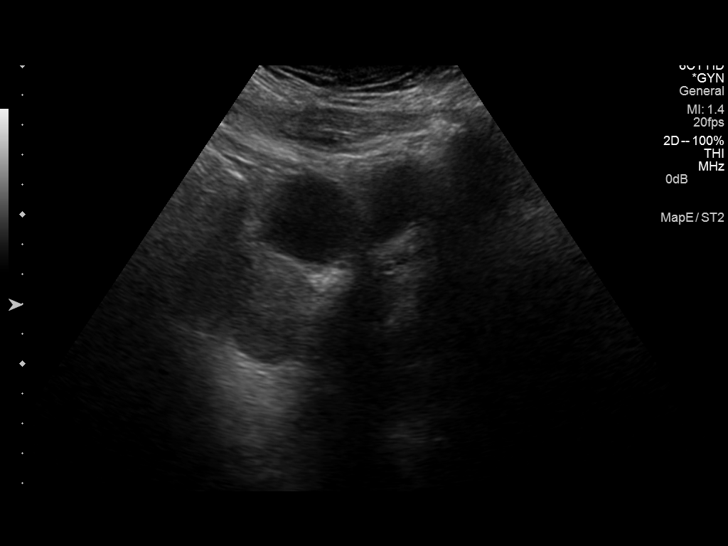
[im 4/45]
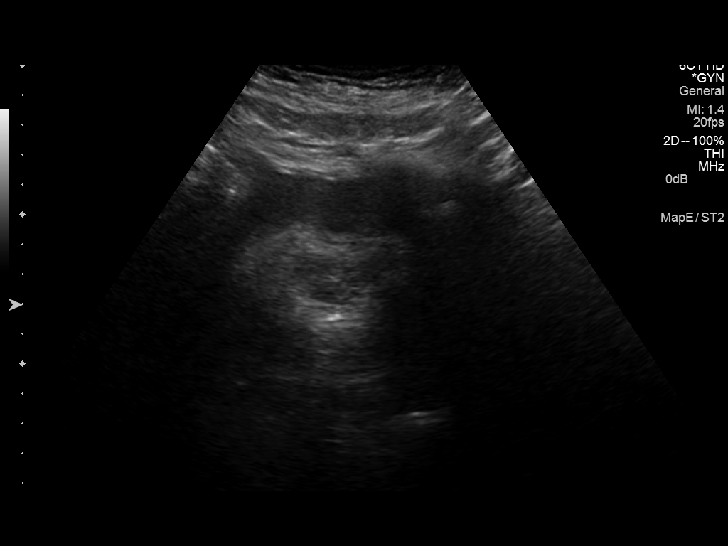
[im 8/45]
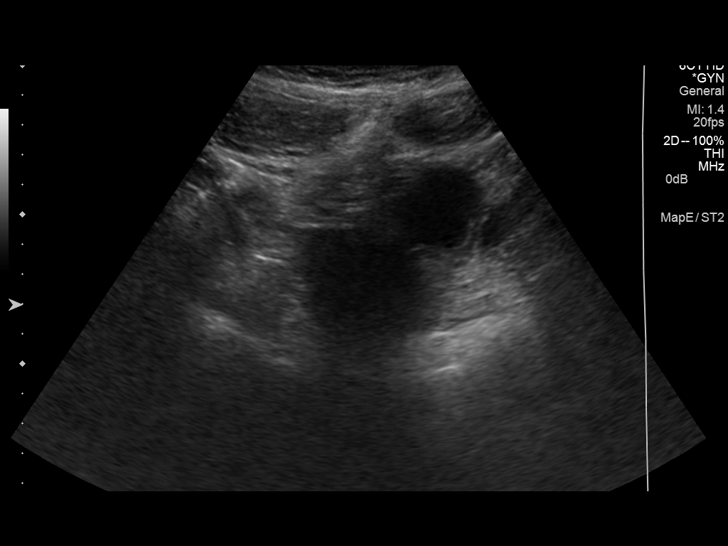
[im 12/45]
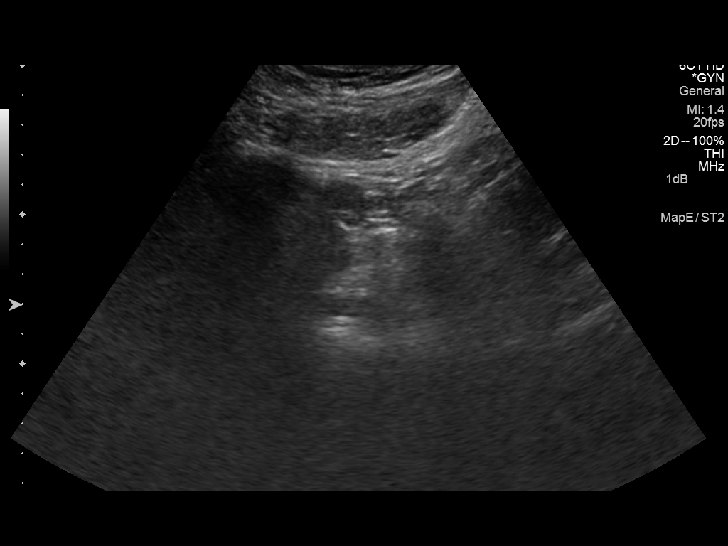
[im 15/45]
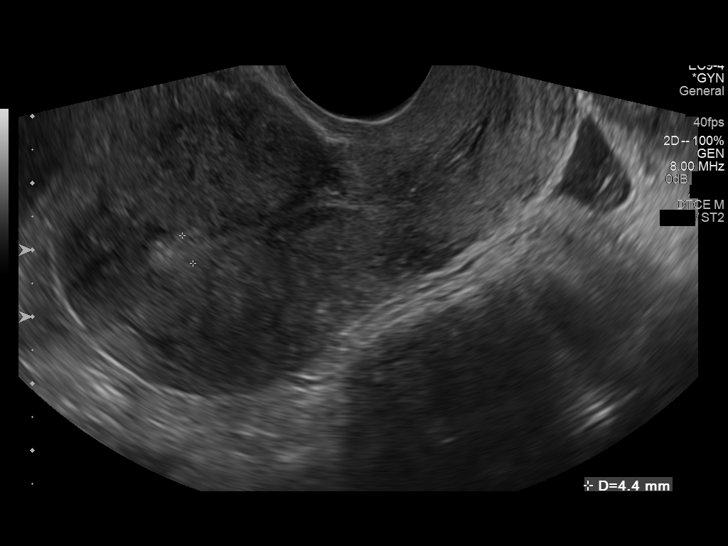
[im 19/45]
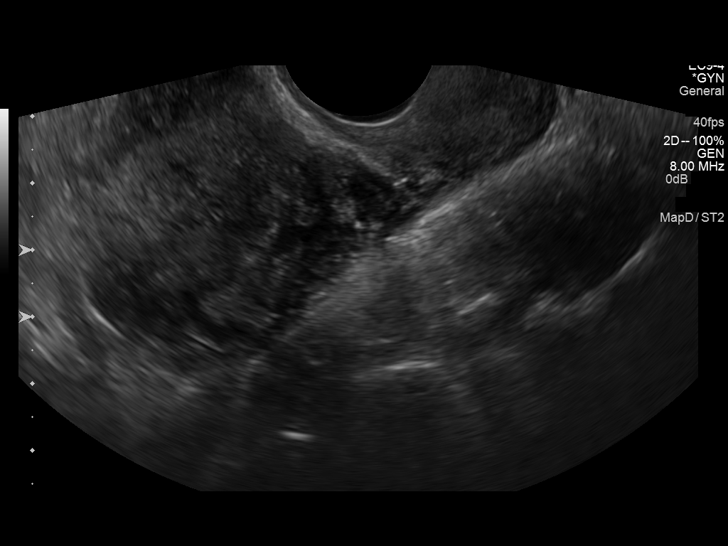
[im 23/45]
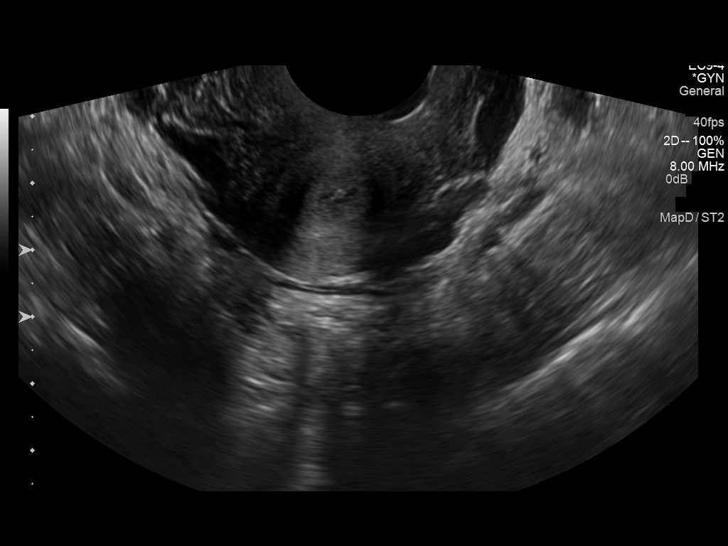
[im 26/45]
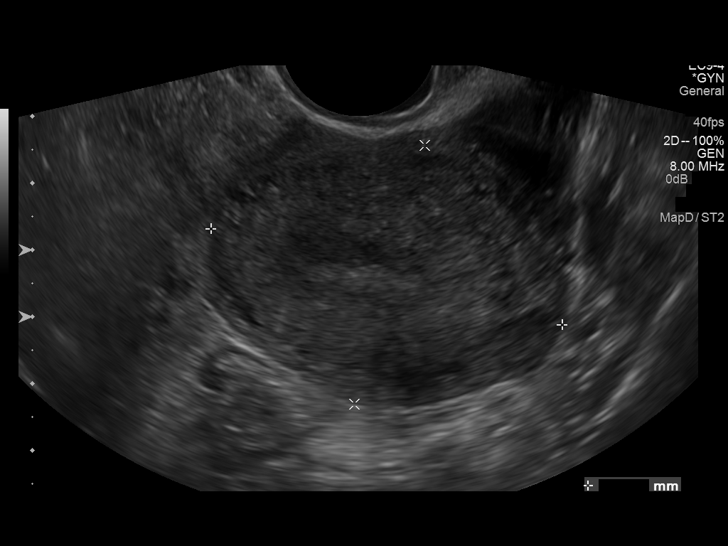
[im 30/45]
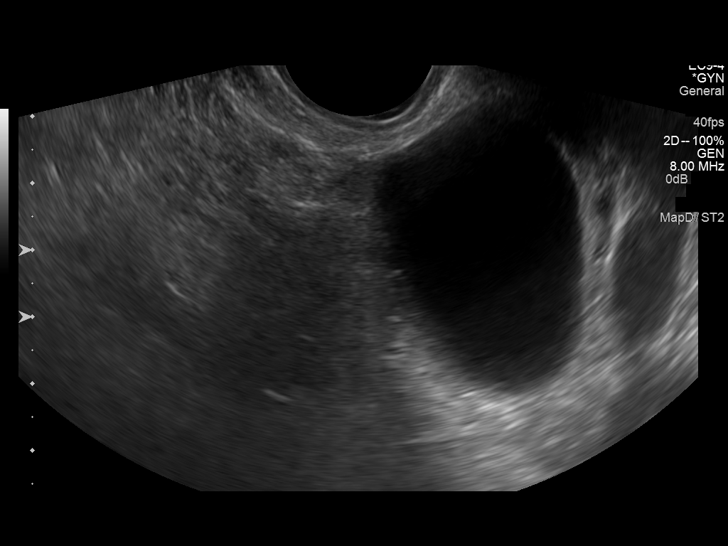
[im 34/45]
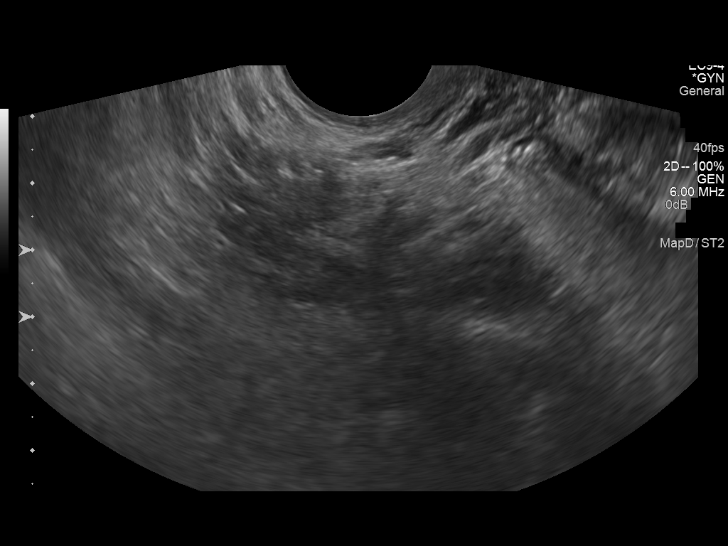
[im 37/45]
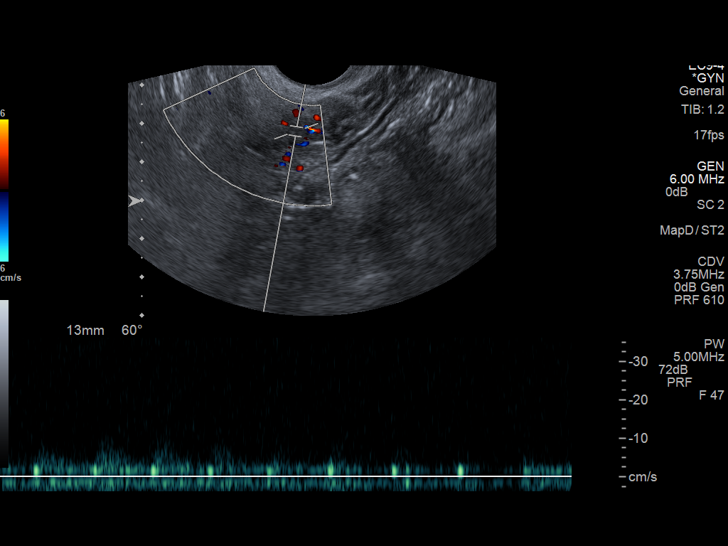
[im 41/45]
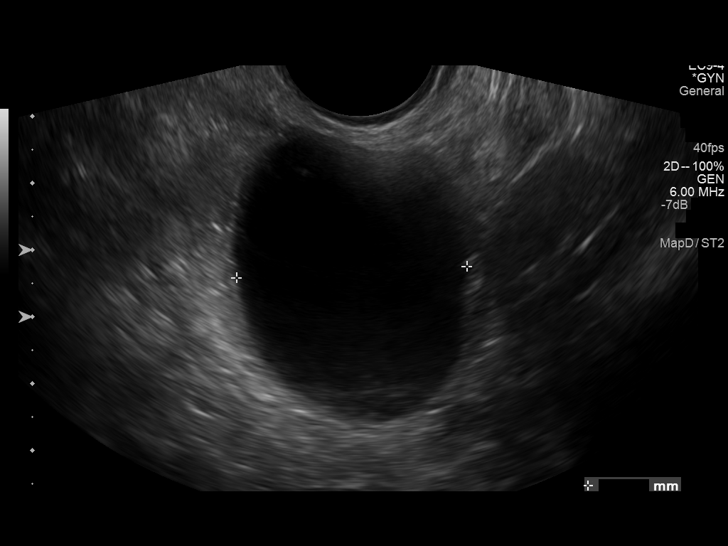
[im 45/45]
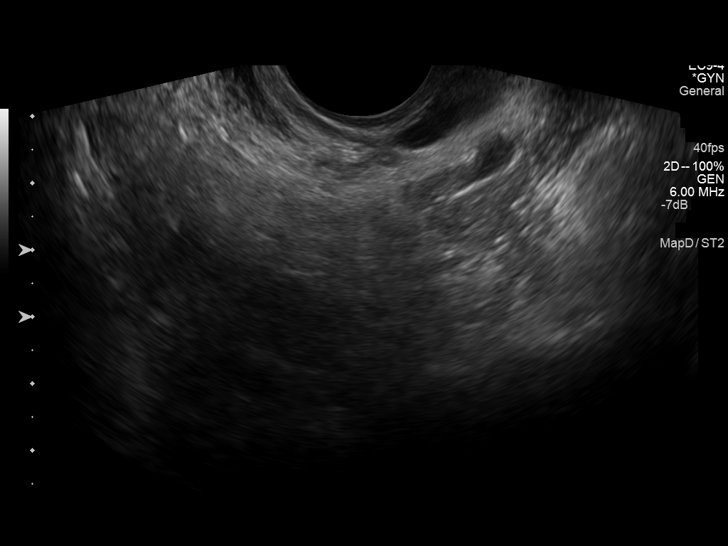

[13 of 25 positions shown; findings below may reference images not displayed]

FINDINGS: Uterus

Measurements: 8.4 x 4.0 x 5.4 cm. No fibroids or other mass
visualized.

Endometrium

Thickness: 0.4 cm.  No focal abnormality visualized.

Right ovary

Measurements: 3.9 x 2.4 x 1.5 cm. Normal appearance/no adnexal mass.

Left ovary

Measurements: 4.5 x 4.3 x 4.0 cm. A 4.2 x 3.4 x 3.1 cm simple cyst
is noted at the left ovary.

Pulsed Doppler evaluation of both ovaries demonstrates normal
low-resistance arterial and venous waveforms.

Other findings

A small amount of particulate ascites is noted within the pelvic
cul-de-sac.
IMPRESSION: 1. Uterus unremarkable in appearance. No evidence of ovarian
torsion.
2. 4.2 cm left ovarian cyst is likely physiologic in nature.
3. Small amount of particulate ascites within the pelvic cul-de-sac.

## 2015-07-26 ENCOUNTER — Ambulatory Visit: Payer: BLUE CROSS/BLUE SHIELD | Admitting: Diagnostic Neuroimaging

## 2015-09-25 ENCOUNTER — Ambulatory Visit: Payer: BLUE CROSS/BLUE SHIELD | Admitting: Diagnostic Neuroimaging

## 2015-09-26 ENCOUNTER — Encounter: Payer: Self-pay | Admitting: Diagnostic Neuroimaging

## 2015-10-01 ENCOUNTER — Encounter (HOSPITAL_COMMUNITY): Payer: Self-pay

## 2015-10-01 ENCOUNTER — Emergency Department (HOSPITAL_COMMUNITY): Payer: Managed Care, Other (non HMO)

## 2015-10-01 ENCOUNTER — Emergency Department (HOSPITAL_COMMUNITY)
Admission: EM | Admit: 2015-10-01 | Discharge: 2015-10-01 | Disposition: A | Discharge status: Home / Self Care | Payer: Managed Care, Other (non HMO) | Attending: Emergency Medicine | Admitting: Emergency Medicine

## 2015-10-01 DIAGNOSIS — Z79899 Other long term (current) drug therapy: Secondary | ICD-10-CM | POA: Diagnosis not present

## 2015-10-01 DIAGNOSIS — R509 Fever, unspecified: Secondary | ICD-10-CM

## 2015-10-01 DIAGNOSIS — R000 Tachycardia, unspecified: Secondary | ICD-10-CM | POA: Insufficient documentation

## 2015-10-01 DIAGNOSIS — J020 Streptococcal pharyngitis: Secondary | ICD-10-CM | POA: Insufficient documentation

## 2015-10-01 DIAGNOSIS — J45909 Unspecified asthma, uncomplicated: Secondary | ICD-10-CM | POA: Diagnosis not present

## 2015-10-01 DIAGNOSIS — Z8659 Personal history of other mental and behavioral disorders: Secondary | ICD-10-CM | POA: Insufficient documentation

## 2015-10-01 DIAGNOSIS — R197 Diarrhea, unspecified: Secondary | ICD-10-CM | POA: Diagnosis not present

## 2015-10-01 DIAGNOSIS — Z791 Long term (current) use of non-steroidal anti-inflammatories (NSAID): Secondary | ICD-10-CM | POA: Insufficient documentation

## 2015-10-01 DIAGNOSIS — R51 Headache: Secondary | ICD-10-CM | POA: Diagnosis not present

## 2015-10-01 DIAGNOSIS — R519 Headache, unspecified: Secondary | ICD-10-CM

## 2015-10-01 DIAGNOSIS — R112 Nausea with vomiting, unspecified: Secondary | ICD-10-CM | POA: Insufficient documentation

## 2015-10-01 DIAGNOSIS — J029 Acute pharyngitis, unspecified: Secondary | ICD-10-CM

## 2015-10-01 LAB — COMPREHENSIVE METABOLIC PANEL
ALBUMIN: 4 g/dL (ref 3.5–5.0)
ALK PHOS: 76 U/L (ref 38–126)
ALT: 11 U/L — ABNORMAL LOW (ref 14–54)
ANION GAP: 7 (ref 5–15)
AST: 18 U/L (ref 15–41)
BILIRUBIN TOTAL: 0.8 mg/dL (ref 0.3–1.2)
BUN: 7 mg/dL (ref 6–20)
CALCIUM: 8.7 mg/dL — AB (ref 8.9–10.3)
CO2: 23 mmol/L (ref 22–32)
Chloride: 105 mmol/L (ref 101–111)
Creatinine, Ser: 0.66 mg/dL (ref 0.44–1.00)
GFR calc Af Amer: >60 mL/min (ref >60)
GLUCOSE: 110 mg/dL — AB (ref 65–99)
POTASSIUM: 3.7 mmol/L (ref 3.5–5.1)
Sodium: 135 mmol/L (ref 135–145)
Total Protein: 7.1 g/dL (ref 6.5–8.1)

## 2015-10-01 LAB — URINALYSIS, ROUTINE W REFLEX MICROSCOPIC
BILIRUBIN URINE: NEGATIVE
Glucose, UA: NEGATIVE mg/dL
KETONES UR: 40 mg/dL — AB
Nitrite: NEGATIVE
PH: 6.5 (ref 5.0–8.0)
Protein, ur: NEGATIVE mg/dL
SPECIFIC GRAVITY, URINE: 1.027 (ref 1.005–1.030)

## 2015-10-01 LAB — I-STAT BETA HCG BLOOD, ED (MC, WL, AP ONLY)

## 2015-10-01 LAB — CBC WITH DIFFERENTIAL/PLATELET
BASOS PCT: 0 %
Basophils Absolute: 0 10*3/uL (ref 0.0–0.1)
Eosinophils Absolute: 0 10*3/uL (ref 0.0–0.7)
Eosinophils Relative: 0 %
HEMATOCRIT: 40.1 % (ref 36.0–46.0)
Hemoglobin: 13.2 g/dL (ref 12.0–15.0)
Lymphocytes Relative: 7 %
Lymphs Abs: 0.9 10*3/uL (ref 0.7–4.0)
MCH: 28.6 pg (ref 26.0–34.0)
MCHC: 32.9 g/dL (ref 30.0–36.0)
MCV: 86.8 fL (ref 78.0–100.0)
MONOS PCT: 4 %
Monocytes Absolute: 0.5 10*3/uL (ref 0.1–1.0)
NEUTROS ABS: 11.1 10*3/uL — AB (ref 1.7–7.7)
Neutrophils Relative %: 89 %
Platelets: 210 10*3/uL (ref 150–400)
RBC: 4.62 MIL/uL (ref 3.87–5.11)
RDW: 13 % (ref 11.5–15.5)
WBC: 12.5 10*3/uL — ABNORMAL HIGH (ref 4.0–10.5)

## 2015-10-01 LAB — URINE MICROSCOPIC-ADD ON

## 2015-10-01 LAB — I-STAT CG4 LACTIC ACID, ED: Lactic Acid, Venous: 0.96 mmol/L (ref 0.5–2.0)

## 2015-10-01 LAB — RAPID STREP SCREEN (MED CTR MEBANE ONLY): Streptococcus, Group A Screen (Direct): NEGATIVE

## 2015-10-01 MED ORDER — DEXAMETHASONE SODIUM PHOSPHATE 10 MG/ML IJ SOLN
10.0000 mg | Freq: Once | INTRAMUSCULAR | Status: AC
  Administered 2015-10-01: 10 mg via INTRAVENOUS
  Filled 2015-10-01: qty 1

## 2015-10-01 MED ORDER — SODIUM CHLORIDE 0.9 % IV BOLUS (SEPSIS)
1000.0000 mL | Freq: Once | INTRAVENOUS | Status: AC
  Administered 2015-10-01: 1000 mL via INTRAVENOUS

## 2015-10-01 MED ORDER — ONDANSETRON 4 MG PO TBDP: 4.0000 mg | ORAL_TABLET | Freq: Three times a day (TID) | ORAL | Status: AC | PRN

## 2015-10-01 MED ORDER — PROCHLORPERAZINE EDISYLATE 5 MG/ML IJ SOLN
10.0000 mg | Freq: Once | INTRAMUSCULAR | Status: AC
  Administered 2015-10-01: 10 mg via INTRAVENOUS
  Filled 2015-10-01: qty 2

## 2015-10-01 MED ORDER — DIPHENHYDRAMINE HCL 50 MG/ML IJ SOLN
25.0000 mg | Freq: Once | INTRAMUSCULAR | Status: AC
  Administered 2015-10-01: 25 mg via INTRAVENOUS
  Filled 2015-10-01: qty 1

## 2015-10-01 NOTE — ED Notes (Signed)
This RN called Dr. Dalene SeltzerSchlossman to inform her of patients symptoms and vital signs. No additional orders at this time.

## 2015-10-01 NOTE — ED Notes (Signed)
Pt up to restroom.  Gait steady and even.  Able to ambulate independently 

## 2015-10-01 NOTE — ED Provider Notes (Signed)
CSN: [id number]     Arrival date & time 10/01/15  1901 History   First MD Initiated Contact with Patient 10/01/15 1929     Chief Complaint  Patient presents with  . Fever     (Consider location/radiation/quality/duration/timing/severity/associated sxs/prior Treatment) HPI   30 year old female with a history of asthma and headaches presents with concern for fever, sore throat, headache, nausea and vomiting. Patient was seen earlier today at urgent care [facility] where a strep swab was done, and she was told it was positive, and discharged with a prescription for Augmentin. Patient reports that she developed nausea and vomiting after trying to take the medications, presented here given worsening headache, continued high fever and other symptoms. Symptoms began yesterday with fever, sore throat, severe.  Headache worsening throughout the day. Rotating tylenol/ibuprofen not helping. Diffuse body aches, including aching neck but similar to diffuse aches. Nausea and vomiting today.  A few loose stool.  Tick bite 3 wks ago  Past Medical History  Diagnosis Date  . Asthma   . Preterm contractions     2 prior pregnancies  . Depression   . Headache    Past Surgical History  Procedure Laterality Date  . Hernia repair    . Cesarean section    . Tendon repair    . Cesarean section N/A 11/27/2014    Procedure: REPEAT CESAREAN SECTION;  Surgeon: Shea EvansVaishali Mody, MD;  Location: [facility];  Service: Obstetrics;  Laterality: N/A;  . Dilation and curettage of uterus    . Tonsillectomy    . Wisdom tooth extraction    . Finger surgery    . Shoulder surgery Right 2015   Family History  Problem Relation Age of Onset  . Cancer Paternal Grandmother     COLON  . Hypertension Paternal Grandfather   . Heart disease Paternal Grandfather   . Diabetes Paternal Grandfather   . Depression Mother   . Heart attack Father   . Colon cancer Father   . Asthma Father   . Colon cancer Maternal Grandmother   . Liver  cancer Maternal Grandmother   . Diabetes Maternal Grandmother    Social History  Substance Use Topics  . Smoking status: Never Smoker   . Smokeless tobacco: Never Used  . Alcohol Use: 0.0 oz/week    0 Standard drinks or equivalent per week     Comment: socially   OB History    Gravida Para Term Preterm AB TAB SAB Ectopic Multiple Living   5 3 3  2  2   0 1     Review of Systems  Constitutional: Positive for fever, appetite change and fatigue.  HENT: Positive for sore throat.   Eyes: Negative for visual disturbance.  Respiratory: Negative for cough and shortness of breath.   Cardiovascular: Negative for chest pain.  Gastrointestinal: Positive for nausea, vomiting and diarrhea. Negative for abdominal pain and constipation.  Genitourinary: Negative for dysuria and difficulty urinating.  Musculoskeletal: Positive for myalgias and arthralgias. Negative for back pain and neck pain.  Skin: Negative for rash.  Neurological: Positive for headaches. Negative for syncope.      Allergies  Shellfish allergy and Iodine  Home Medications   Prior to Admission medications   Medication Sig Start Date End Date Taking? Authorizing Provider  acetaminophen (TYLENOL) 500 MG tablet Take 1,000 mg by mouth every 6 (six) hours as needed for mild pain or headache.     Historical Provider, MD  albuterol (PROVENTIL HFA;VENTOLIN HFA) 108 (90 BASE)  MCG/ACT inhaler Inhale 2 puffs into the lungs every 6 (six) hours as needed for wheezing or shortness of breath.    Historical Provider, MD  ibuprofen (ADVIL,MOTRIN) 600 MG tablet Take 1 tablet (600 mg total) by mouth every 6 (six) hours. 11/29/14   Rolitta Arita Miss, CNM  ondansetron (ZOFRAN ODT) 4 MG disintegrating tablet Take 1 tablet (4 mg total) by mouth every 8 (eight) hours as needed for nausea or vomiting. 10/01/15   Alvira Monday, MD  Prenatal Vit-Fe Fumarate-FA (PRENATAL MULTIVITAMIN) TABS tablet Take 1 tablet by mouth.     Historical Provider, MD   BP  116/67 mmHg  Pulse 109  Temp(Src) 98.4 F (36.9 C) (Oral)  Resp 16  SpO2 99%  LMP 10/01/2015  Breastfeeding? No Physical Exam  Constitutional: She is oriented to person, place, and time. She appears well-developed and well-nourished. No distress.  HENT:  Head: Normocephalic and atraumatic.  Mouth/Throat: No uvula swelling. Posterior oropharyngeal erythema (petechiae) present. No oropharyngeal exudate or tonsillar abscesses.  Eyes: Conjunctivae and EOM are normal.  Neck: Normal range of motion and phonation normal. No rigidity. Normal range of motion present. No Brudzinski's sign and no Kernig's sign noted.  Cardiovascular: Regular rhythm, normal heart sounds and intact distal pulses.  Tachycardia present.  Exam reveals no gallop and no friction rub.   No murmur heard. Pulmonary/Chest: Effort normal and breath sounds normal. No respiratory distress. She has no wheezes. She has no rales.  Abdominal: Soft. She exhibits no distension. There is no tenderness. There is no guarding.  Musculoskeletal: She exhibits no edema or tenderness.  Lymphadenopathy:    She has cervical adenopathy.       Right cervical: Superficial cervical adenopathy present.  Neurological: She is alert and oriented to person, place, and time. She has normal strength. No cranial nerve deficit or sensory deficit. Coordination and gait normal. GCS eye subscore is 4. GCS verbal subscore is 5. GCS motor subscore is 6.  Skin: Skin is warm and dry. No rash noted. She is not diaphoretic. No erythema.  Nursing note and vitals reviewed.   ED Course  Procedures (including critical care time) Labs Review Labs Reviewed  CBC WITH DIFFERENTIAL/PLATELET - Abnormal; Notable for the following:    WBC 12.5 (*)    Neutro Abs 11.1 (*)    All other components within normal limits  COMPREHENSIVE METABOLIC PANEL - Abnormal; Notable for the following:    Glucose, Bld 110 (*)    Calcium 8.7 (*)    ALT 11 (*)    All other components  within normal limits  URINALYSIS, ROUTINE W REFLEX MICROSCOPIC (NOT AT [facility]) - Abnormal; Notable for the following:    APPearance CLOUDY (*)    Hgb urine dipstick MODERATE (*)    Ketones, ur 40 (*)    Leukocytes, UA SMALL (*)    All other components within normal limits  URINE MICROSCOPIC-ADD ON - Abnormal; Notable for the following:    Squamous Epithelial / LPF 6-30 (*)    Bacteria, UA MANY (*)    All other components within normal limits  RAPID STREP SCREEN (NOT AT [facility])  CULTURE, GROUP A STREP ([facility])  I-STAT CG4 LACTIC ACID, ED  I-STAT BETA HCG BLOOD, ED ([facility], [facility], [facility] ONLY)    Imaging Review Dg Chest 2 View  10/01/2015  CLINICAL DATA:  Fever and body aches. EXAM: CHEST  2 VIEW COMPARISON:  None. FINDINGS: The heart size and mediastinal contours are within normal limits. Both lungs are clear.  The visualized skeletal structures are unremarkable. IMPRESSION: No active cardiopulmonary disease. Electronically Signed   By: Marlan Palau M.D.   On: 10/01/2015 20:05   I have personally reviewed and evaluated these images and lab results as part of my medical decision-making.   EKG Interpretation None      MDM   Final diagnoses:  Strep throat  Acute nonintractable headache, unspecified headache type  Sore throat  Fever, unspecified fever cause   30 year old female with a history of asthma and headaches presents with concern for fever, sore throat, headache, nausea and vomiting. Patient was seen earlier today at urgent care [facility] where a strep swab was done, and she was told it was positive, and discharged with a prescription for Augmentin. Patient reports that she developed nausea and vomiting after trying to take the medications, presented here given worsening headache, continued high fever and other symptoms. Patient without meningeal signs on exam, full range of motion of the neck, normal mental status and in setting of positive strep screen, no meningeal signs, have low suspicion  for meningitis. She reports tick bite 3 weeks ago, and is outside of a window for concern of RMSF, and also does not have a rash. No sign of peritonsillar abscess or epiglottitis. Benign abdominal exam.  CXR negative. Strep screen have been ordered in triage, and returned negative. Patient with petechiae on oropharyngeal exam, lymphadenopathy, headache and fever with positive strep screen at [facility], and feel she has Strep Throat despite negative screen here.  Labs obtained show normal lactic acid, normal renal function, mildly elevated white count at 12.6. Patient was initially tachycardic to the 120s and febrile. Patient was given IV fluids, Decadron, Compazine and Benadryl for concern of his strep pharyngitis and headache with improvement of symptoms. Her heart rate decreased to the low 100s, and feel tachycardia is likely secondary to mild continuing fever and dehydration. Recommended continued hydration, and given a prescription for Zofran. Patient with urinalysis which is equivocal for UTI with contamination. Given higher concern for strep throat as a cause of symptoms, will continue patient on Augmentin, which will also cover most urinary tract infections. Discussed reasons to return to the emergency department and further care with patient in detail. Patient discharged in stable condition with understanding of reasons to return.       Alvira Monday, MD 10/02/15 [phone]

## 2015-10-01 NOTE — ED Notes (Signed)
Pt is currently in xray.

## 2015-10-01 NOTE — ED Notes (Addendum)
Pt here with c/o fever of 101 yesterday. [facility] and was diagnosed with strep throat but says her fever spiked to 103.5. She took tylenol at 1810 today. She also reports body aches, lower back pain, neck stiffness, headache and vomiting. Mask placed on patient upon arrival.

## 2015-10-01 NOTE — ED Notes (Signed)
MD at bedside. 

## 2015-10-01 NOTE — Discharge Instructions (Signed)
Fever, Adult A fever is an increase in the body's temperature. It is usually defined as a temperature of 100F (38C) or higher. Brief mild or moderate fevers generally have no long-term effects, and they often do not require treatment. Moderate or high fevers may make you feel uncomfortable and can sometimes be a sign of a serious illness or disease. The sweating that may occur with repeated or prolonged fever may also cause dehydration. Fever is confirmed by taking a temperature with a thermometer. A measured temperature can vary with:  Age.  Time of day.  Location of the thermometer:  Mouth (oral).  Rectum (rectal).  Ear (tympanic).  Underarm (axillary).  Forehead (temporal). HOME CARE INSTRUCTIONS Pay attention to any changes in your symptoms. Take these actions to help with your condition:  Take over-the counter and prescription medicines only as told by your health care provider. Follow the dosing instructions carefully.  If you were prescribed an antibiotic medicine, take it as told by your health care provider. Do not stop taking the antibiotic even if you start to feel better.  Rest as needed.  Drink enough fluid to keep your urine clear or pale yellow. This helps to prevent dehydration.  Sponge yourself or bathe with room-temperature water to help reduce your body temperature as needed. Do not use ice water.  Do not overbundle yourself in blankets or heavy clothes. SEEK MEDICAL CARE IF:  You vomit.  You cannot eat or drink without vomiting.  You have diarrhea.  You have pain when you urinate.  Your symptoms do not improve with treatment.  You develop new symptoms.  You develop excessive weakness. SEEK IMMEDIATE MEDICAL CARE IF:  You have shortness of breath or have trouble breathing.  You are dizzy or you faint.  You are disoriented or confused.  You develop signs of dehydration, such as a dry mouth, decreased urination, or paleness.  You develop  severe pain in your abdomen.  You have persistent vomiting or diarrhea.  You develop a skin rash.  Your symptoms suddenly get worse.   This information is not intended to replace advice given to you by your health care provider. Make sure you discuss any questions you have with your health care provider.   Document Released: 10/15/2000 Document Revised: 01/10/2015 Document Reviewed: 06/15/2014 Elsevier Interactive Patient Education 2016 [employer]  

## 2015-10-01 NOTE — ED Notes (Signed)
Patient able to ambulate independently  

## 2015-10-03 LAB — CULTURE, GROUP A STREP (THRC)

## 2019-09-29 NOTE — Telephone Encounter (Signed)
Pt r/s'ed her aqppt due to the dr. Duane Boston to go back to the hospital during clinic. - pt needs a refill on her stool softner.

## 2019-09-30 MED ORDER — SENNOSIDES-DOCUSATE SODIUM 8.6-50 MG PO TABS
ORAL_TABLET | Freq: Every day | ORAL | 3 refills | Status: AC
Start: 2019-09-30 — End: 2019-10-10

## 2019-09-30 NOTE — Addendum Note (Signed)
Addended by: Ilene Qua on: 09/30/2019 10:14 AM     Modules accepted: Orders
